# Patient Record
Sex: Female | Born: 1965 | Race: White | Hispanic: No | Marital: Married | Smoking: Never smoker
Health system: Southern US, Community
[De-identification: ages and names within clinical notes are randomized; demographics above are authoritative.]

## PROBLEM LIST (undated history)

## (undated) DIAGNOSIS — E079 Disorder of thyroid, unspecified: Secondary | ICD-10-CM

## (undated) DIAGNOSIS — R7303 Prediabetes: Secondary | ICD-10-CM

## (undated) DIAGNOSIS — C801 Malignant (primary) neoplasm, unspecified: Secondary | ICD-10-CM

## (undated) DIAGNOSIS — M1990 Unspecified osteoarthritis, unspecified site: Secondary | ICD-10-CM

## (undated) HISTORY — PX: DILATION AND CURETTAGE OF UTERUS: SHX78

## (undated) HISTORY — DX: Disorder of thyroid, unspecified: E07.9

## (undated) HISTORY — DX: Unspecified osteoarthritis, unspecified site: M19.90

## (undated) HISTORY — PX: TONSILLECTOMY: SUR1361

---

## 1998-05-24 ENCOUNTER — Other Ambulatory Visit: Admission: RE | Admit: 1998-05-24 | Discharge: 1998-05-24 | Payer: Self-pay | Admitting: Obstetrics and Gynecology

## 1999-05-05 ENCOUNTER — Encounter (INDEPENDENT_AMBULATORY_CARE_PROVIDER_SITE_OTHER): Payer: Self-pay | Admitting: Specialist

## 1999-05-05 ENCOUNTER — Ambulatory Visit (HOSPITAL_COMMUNITY): Admission: RE | Admit: 1999-05-05 | Discharge: 1999-05-05 | Payer: Self-pay | Admitting: Obstetrics and Gynecology

## 1999-10-26 ENCOUNTER — Other Ambulatory Visit: Admission: RE | Admit: 1999-10-26 | Discharge: 1999-10-26 | Payer: Self-pay | Admitting: Obstetrics and Gynecology

## 2000-05-05 ENCOUNTER — Inpatient Hospital Stay (HOSPITAL_COMMUNITY): Admission: AD | Admit: 2000-05-05 | Discharge: 2000-05-07 | Payer: Self-pay | Admitting: Obstetrics and Gynecology

## 2000-05-08 ENCOUNTER — Encounter: Admission: RE | Admit: 2000-05-08 | Discharge: 2000-08-06 | Payer: Self-pay | Admitting: Obstetrics and Gynecology

## 2000-06-12 ENCOUNTER — Other Ambulatory Visit: Admission: RE | Admit: 2000-06-12 | Discharge: 2000-06-12 | Payer: Self-pay | Admitting: Obstetrics and Gynecology

## 2001-06-26 ENCOUNTER — Other Ambulatory Visit: Admission: RE | Admit: 2001-06-26 | Discharge: 2001-06-26 | Payer: Self-pay | Admitting: Obstetrics and Gynecology

## 2002-09-23 ENCOUNTER — Other Ambulatory Visit: Admission: RE | Admit: 2002-09-23 | Discharge: 2002-09-23 | Payer: Self-pay | Admitting: Obstetrics and Gynecology

## 2003-04-07 ENCOUNTER — Inpatient Hospital Stay (HOSPITAL_COMMUNITY): Admission: AD | Admit: 2003-04-07 | Discharge: 2003-04-09 | Payer: Self-pay | Admitting: Obstetrics and Gynecology

## 2003-05-20 ENCOUNTER — Other Ambulatory Visit: Admission: RE | Admit: 2003-05-20 | Discharge: 2003-05-20 | Payer: Self-pay | Admitting: Obstetrics and Gynecology

## 2004-06-27 ENCOUNTER — Other Ambulatory Visit: Admission: RE | Admit: 2004-06-27 | Discharge: 2004-06-27 | Payer: Self-pay | Admitting: Obstetrics and Gynecology

## 2005-10-19 ENCOUNTER — Other Ambulatory Visit: Admission: RE | Admit: 2005-10-19 | Discharge: 2005-10-19 | Payer: Self-pay | Admitting: Obstetrics and Gynecology

## 2008-09-04 HISTORY — PX: TOTAL HIP ARTHROPLASTY: SHX124

## 2010-04-27 ENCOUNTER — Inpatient Hospital Stay (HOSPITAL_COMMUNITY): Admission: RE | Admit: 2010-04-27 | Discharge: 2010-04-30 | Payer: Self-pay | Admitting: Orthopedic Surgery

## 2010-09-27 ENCOUNTER — Encounter
Admission: RE | Admit: 2010-09-27 | Discharge: 2010-09-27 | Payer: Self-pay | Source: Home / Self Care | Attending: Obstetrics and Gynecology | Admitting: Obstetrics and Gynecology

## 2010-11-18 LAB — COMPREHENSIVE METABOLIC PANEL
Alkaline Phosphatase: 65 U/L (ref 39–117)
Alkaline Phosphatase: 71 U/L (ref 39–117)
BUN: 10 mg/dL (ref 6–23)
BUN: 9 mg/dL (ref 6–23)
CO2: 31 mEq/L (ref 19–32)
Calcium: 9.7 mg/dL (ref 8.4–10.5)
Chloride: 106 mEq/L (ref 96–112)
Chloride: 106 mEq/L (ref 96–112)
GFR calc Af Amer: >60 mL/min (ref >60)
GFR calc non Af Amer: >60 mL/min (ref >60)
Potassium: 4.9 mEq/L (ref 3.5–5.1)
Sodium: 139 mEq/L (ref 135–145)

## 2010-11-18 LAB — BASIC METABOLIC PANEL
BUN: 2 mg/dL — ABNORMAL LOW (ref 6–23)
CO2: 27 mEq/L (ref 19–32)
Calcium: 8.3 mg/dL — ABNORMAL LOW (ref 8.4–10.5)
Chloride: 109 mEq/L (ref 96–112)
Creatinine, Ser: 0.52 mg/dL (ref 0.4–1.2)
GFR calc Af Amer: >60 mL/min (ref >60)
GFR calc Af Amer: >60 mL/min (ref >60)
GFR calc non Af Amer: >60 mL/min (ref >60)
GFR calc non Af Amer: >60 mL/min (ref >60)
Potassium: 4.6 mEq/L (ref 3.5–5.1)
Sodium: 137 mEq/L (ref 135–145)
Sodium: 140 mEq/L (ref 135–145)

## 2010-11-18 LAB — SURGICAL PCR SCREEN
MRSA, PCR: NEGATIVE
Staphylococcus aureus: POSITIVE — AB

## 2010-11-18 LAB — PREGNANCY, URINE: Preg Test, Ur: NEGATIVE

## 2010-11-18 LAB — URINALYSIS, ROUTINE W REFLEX MICROSCOPIC
Hgb urine dipstick: NEGATIVE
Nitrite: NEGATIVE
Protein, ur: NEGATIVE mg/dL
Specific Gravity, Urine: 1.004 — ABNORMAL LOW (ref 1.005–1.030)
Urobilinogen, UA: 0.2 mg/dL (ref 0.0–1.0)

## 2010-11-18 LAB — TYPE AND SCREEN: ABO/RH(D): O POS

## 2010-11-18 LAB — CBC
HCT: 30.5 % — ABNORMAL LOW (ref 36.0–46.0)
Hemoglobin: 10 g/dL — ABNORMAL LOW (ref 12.0–15.0)
Hemoglobin: 10.6 g/dL — ABNORMAL LOW (ref 12.0–15.0)
MCH: 30.7 pg (ref 26.0–34.0)
MCHC: 34.3 g/dL (ref 30.0–36.0)
MCHC: 34.7 g/dL (ref 30.0–36.0)
MCHC: 34.7 g/dL (ref 30.0–36.0)
MCV: 90 fL (ref 78.0–100.0)
MCV: 90.2 fL (ref 78.0–100.0)
MCV: 91.3 fL (ref 78.0–100.0)
Platelets: 194 10*3/uL (ref 150–400)
RBC: 3.22 MIL/uL — ABNORMAL LOW (ref 3.87–5.11)
RBC: 3.39 MIL/uL — ABNORMAL LOW (ref 3.87–5.11)
RBC: 4.85 MIL/uL (ref 3.87–5.11)
RDW: 12.1 % (ref 11.5–15.5)
RDW: 12.6 % (ref 11.5–15.5)
WBC: 8.8 10*3/uL (ref 4.0–10.5)

## 2010-11-18 LAB — PROTIME-INR
INR: 2.05 — ABNORMAL HIGH (ref 0.00–1.49)
Prothrombin Time: 13.4 seconds (ref 11.6–15.2)
Prothrombin Time: 16 seconds — ABNORMAL HIGH (ref 11.6–15.2)
Prothrombin Time: 19.8 seconds — ABNORMAL HIGH (ref 11.6–15.2)

## 2011-01-16 ENCOUNTER — Other Ambulatory Visit: Payer: Self-pay | Admitting: Obstetrics and Gynecology

## 2011-01-16 DIAGNOSIS — N611 Abscess of the breast and nipple: Secondary | ICD-10-CM

## 2011-01-27 ENCOUNTER — Ambulatory Visit
Admission: RE | Admit: 2011-01-27 | Discharge: 2011-01-27 | Disposition: A | Discharge status: Home / Self Care | Payer: 59 | Source: Ambulatory Visit | Attending: Obstetrics and Gynecology | Admitting: Obstetrics and Gynecology

## 2011-01-27 DIAGNOSIS — N611 Abscess of the breast and nipple: Secondary | ICD-10-CM

## 2011-07-11 ENCOUNTER — Inpatient Hospital Stay (HOSPITAL_COMMUNITY): Admit: 2011-07-11 | Payer: Self-pay

## 2013-02-11 ENCOUNTER — Other Ambulatory Visit: Payer: Self-pay | Admitting: Obstetrics and Gynecology

## 2013-02-11 DIAGNOSIS — R922 Inconclusive mammogram: Secondary | ICD-10-CM

## 2013-02-11 DIAGNOSIS — R9230 Dense breasts, unspecified: Secondary | ICD-10-CM

## 2013-02-17 ENCOUNTER — Ambulatory Visit
Admission: RE | Admit: 2013-02-17 | Discharge: 2013-02-17 | Disposition: A | Discharge status: Home / Self Care | Payer: PRIVATE HEALTH INSURANCE | Source: Ambulatory Visit | Attending: Obstetrics and Gynecology | Admitting: Obstetrics and Gynecology

## 2013-02-17 DIAGNOSIS — R922 Inconclusive mammogram: Secondary | ICD-10-CM

## 2013-02-17 MED ORDER — GADOBENATE DIMEGLUMINE 529 MG/ML IV SOLN
11.0000 mL | Freq: Once | INTRAVENOUS | Status: AC | PRN
  Administered 2013-02-17: 11 mL via INTRAVENOUS

## 2013-03-31 ENCOUNTER — Other Ambulatory Visit: Payer: Self-pay | Admitting: Endocrinology

## 2013-03-31 DIAGNOSIS — E049 Nontoxic goiter, unspecified: Secondary | ICD-10-CM

## 2013-04-02 ENCOUNTER — Ambulatory Visit
Admission: RE | Admit: 2013-04-02 | Discharge: 2013-04-02 | Disposition: A | Discharge status: Home / Self Care | Payer: PRIVATE HEALTH INSURANCE | Source: Ambulatory Visit | Attending: Endocrinology | Admitting: Endocrinology

## 2013-04-02 DIAGNOSIS — E049 Nontoxic goiter, unspecified: Secondary | ICD-10-CM

## 2013-04-07 ENCOUNTER — Other Ambulatory Visit: Payer: Self-pay | Admitting: Endocrinology

## 2013-04-07 DIAGNOSIS — E042 Nontoxic multinodular goiter: Secondary | ICD-10-CM

## 2013-04-09 ENCOUNTER — Other Ambulatory Visit (HOSPITAL_COMMUNITY)
Admission: RE | Admit: 2013-04-09 | Discharge: 2013-04-09 | Disposition: A | Discharge status: Home / Self Care | Payer: 59 | Source: Ambulatory Visit | Attending: Interventional Radiology | Admitting: Interventional Radiology

## 2013-04-09 ENCOUNTER — Ambulatory Visit
Admission: RE | Admit: 2013-04-09 | Discharge: 2013-04-09 | Disposition: A | Discharge status: Home / Self Care | Payer: 59 | Source: Ambulatory Visit | Attending: Endocrinology | Admitting: Endocrinology

## 2013-04-09 DIAGNOSIS — E041 Nontoxic single thyroid nodule: Secondary | ICD-10-CM | POA: Insufficient documentation

## 2013-04-09 DIAGNOSIS — E042 Nontoxic multinodular goiter: Secondary | ICD-10-CM

## 2013-04-28 ENCOUNTER — Encounter (INDEPENDENT_AMBULATORY_CARE_PROVIDER_SITE_OTHER): Payer: Self-pay | Admitting: Surgery

## 2013-04-30 ENCOUNTER — Encounter (INDEPENDENT_AMBULATORY_CARE_PROVIDER_SITE_OTHER): Payer: Self-pay | Admitting: Surgery

## 2013-04-30 ENCOUNTER — Ambulatory Visit (INDEPENDENT_AMBULATORY_CARE_PROVIDER_SITE_OTHER): Payer: 59 | Admitting: Surgery

## 2013-04-30 VITALS — BP 110/64 | HR 62 | Temp 97.7°F | Resp 12 | Ht 64.0 in | Wt 131.0 lb

## 2013-04-30 DIAGNOSIS — D449 Neoplasm of uncertain behavior of unspecified endocrine gland: Secondary | ICD-10-CM

## 2013-04-30 DIAGNOSIS — D440 Neoplasm of uncertain behavior of thyroid gland: Secondary | ICD-10-CM | POA: Insufficient documentation

## 2013-04-30 DIAGNOSIS — E042 Nontoxic multinodular goiter: Secondary | ICD-10-CM | POA: Insufficient documentation

## 2013-04-30 NOTE — Progress Notes (Signed)
General Surgery [facility]  Chief Complaint  Patient presents with  . New Evaluation    thyroid nodules with Hurthle cell changes - referral from Dr. Dorisann Frames    HISTORY: Patient is a 47 year old female referred by her endocrinologist and her gynecologist for evaluation of bilateral thyroid nodules. These were found on physical examination. Patient underwent a thyroid ultrasound which showed a dominant nodule in the right lobe measuring 2.4 cm and a second nodule in the inferior left lobe measuring 1.4 cm. Subsequent fine-needle aspiration biopsy showed cytologic atypia with Hurthle cell change and nuclear changes in the left thyroid nodule. Right thyroid nodule cytopathology was benign. TSH level was slightly elevated at 4.79 and the patient was recently started on Synthroid 75 mcg daily. Patient is now referred for consideration for thyroidectomy for definitive diagnosis of the suspicious left thyroid nodule.  Patient has a twin sister with hypothyroidism. There is no family history of thyroid malignancy. There is no family history of other endocrine neoplasm. Patient has had no prior thyroid problems. She has never been on thyroid medication until the present time.  Past Medical History  Diagnosis Date  . Thyroid disease   . Arthritis     Current Outpatient Prescriptions  Medication Sig Dispense Refill  . levothyroxine (SYNTHROID, LEVOTHROID) 75 MCG tablet Take 75 mcg by mouth daily before breakfast.       No current facility-administered medications for this visit.    No Known Allergies  History reviewed. No pertinent family history.  History   Social History  . Marital Status: Married    Spouse Name: N/A    Number of Children: N/A  . Years of Education: N/A   Social History Main Topics  . Smoking status: Never Smoker   . Smokeless tobacco: None  . Alcohol Use: Yes     Comment: 1/2 a week  . Drug Use: No  . Sexual Activity: None   Other  Topics Concern  . None   Social History Narrative  . None    REVIEW OF SYSTEMS - PERTINENT POSITIVES ONLY: Denies tremor. Occasional palpitations. No significant compressive symptoms.  EXAM: Filed Vitals:   04/30/13 0854  BP: 110/64  Pulse: 62  Temp: 97.7 F (36.5 C)  Resp: 12    HEENT: normocephalic; pupils equal and reactive; sclerae clear; dentition good; mucous membranes moist NECK:  Dominant right thyroid nodule, 2.0 cm, mobile, nontender; left thyroid lobe without palpable abnormality; asymmetric on extension; no palpable anterior or posterior cervical lymphadenopathy; no supraclavicular masses; no tenderness CHEST: clear to auscultation bilaterally without rales, rhonchi, or wheezes CARDIAC: regular rate and rhythm without significant murmur; peripheral pulses are full EXT:  non-tender without edema; no deformity NEURO: no gross focal deficits; no sign of tremor   LABORATORY RESULTS: See [facility] (CHL-Epic) for most recent results  RADIOLOGY RESULTS: See [facility] (CHL-Epic) for most recent results  IMPRESSION: #1 multinodular goiter, nontoxic #2 neoplasm of uncertain behavior, left thyroid lobe, 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 I have recommended total thyroidectomy for management of bilateral thyroid nodules and definitive diagnosis of her left thyroid nodule with cytologic atypia. We discussed the procedure at length. We discussed risk and benefits including risk of injury to recurrent laryngeal nerves and parathyroid glands. We discussed the cosmetic results to be anticipated. We discussed the hospital stay. We discussed the  need for lifelong thyroid hormone replacement. We discussed the potential need for radioactive iodine treatment. She understands and wishes to proceed in the near future.  The risks and benefits of the  procedure have been discussed at length with the patient.  The patient understands the proposed procedure, potential alternative treatments, and the course of recovery to be expected.  All of the patient's questions have been answered at this time.  The patient wishes to proceed with surgery.  Velora Heckler, MD, FACS General & Endocrine Surgery [facility]  Primary Care Physician: Turner Daniels, MD

## 2013-04-30 NOTE — Patient Instructions (Signed)

## 2013-05-20 ENCOUNTER — Encounter (HOSPITAL_COMMUNITY): Payer: Self-pay | Admitting: Pharmacy Technician

## 2013-05-21 ENCOUNTER — Other Ambulatory Visit (HOSPITAL_COMMUNITY): Payer: Self-pay | Admitting: Surgery

## 2013-05-21 NOTE — Progress Notes (Signed)
ekg 05-02-13 fast med on chart Chest 2 view xray 05-02-13 fast med on chart

## 2013-05-21 NOTE — Patient Instructions (Addendum)
20 VERNADETTE STUTSMAN  05/21/2013   Your procedure is scheduled on: 05-29-2013  THURSDAY  Report to [facility] at 1100 AM.  Call this number if you have problems the morning of surgery [phone]   Remember:   Do not eat food  :After Midnight. Wednesday NIGHT   clear liquids midnight until 730 am day of surgery, then nothing by mouth.   Take these medicines the morning of surgery with A SIP OF WATER: NONE                                SEE [place] PREPARING FOR SURGERY SHEET             You may not have any metal on your body including hair pins and piercings  Do not wear jewelry, make-up.  Do not wear lotions, powders, or perfumes. You may wear deodorant.   Men may shave face and neck.  Do not bring valuables to the hospital. [facility] IS NOT RESPONSIBLE FOR VALUEABLES.  Contacts, dentures or bridgework may not be worn into surgery.  Leave suitcase in the car. After surgery it may be brought to your room.  For patients admitted to the hospital, checkout time is 11:00 AM the day of discharge.   Patients discharged the day of surgery will not be allowed to drive home.  Name and phone number of your driver:  Special Instructions: N/A   Please read over the following fact sheets that you were given: clear liquid sheet  Call Theodis Aguas RN pre op nurse if needed [phone]    FAILURE TO FOLLOW THESE INSTRUCTIONS MAY RESULT IN THE CANCELLATION OF YOUR SURGERY.  PATIENT SIGNATURE___________________________________________  NURSE SIGNATURE_____________________________________________

## 2013-05-22 ENCOUNTER — Encounter (HOSPITAL_COMMUNITY)
Admission: RE | Admit: 2013-05-22 | Discharge: 2013-05-22 | Disposition: A | Discharge status: Home / Self Care | Payer: 59 | Source: Ambulatory Visit | Attending: Surgery | Admitting: Surgery

## 2013-05-22 ENCOUNTER — Encounter (HOSPITAL_COMMUNITY): Payer: Self-pay

## 2013-05-22 DIAGNOSIS — Z01812 Encounter for preprocedural laboratory examination: Secondary | ICD-10-CM | POA: Insufficient documentation

## 2013-05-22 DIAGNOSIS — Z01818 Encounter for other preprocedural examination: Secondary | ICD-10-CM | POA: Insufficient documentation

## 2013-05-22 LAB — CBC
Hemoglobin: 15.2 g/dL — ABNORMAL HIGH (ref 12.0–15.0)
MCH: 29.9 pg (ref 26.0–34.0)
MCV: 87.8 fL (ref 78.0–100.0)
Platelets: 275 10*3/uL (ref 150–400)
RBC: 5.08 MIL/uL (ref 3.87–5.11)

## 2013-05-27 NOTE — Progress Notes (Signed)
Quick Note:  These results are acceptable for scheduled surgery.  Daden Mahany M. Lister Brizzi, MD, FACS [facility] Office: [phone]   ______ 

## 2013-05-29 ENCOUNTER — Observation Stay (HOSPITAL_COMMUNITY)
Admission: RE | Admit: 2013-05-29 | Discharge: 2013-05-30 | Disposition: A | Discharge status: Home / Self Care | Payer: 59 | Source: Ambulatory Visit | Attending: Surgery | Admitting: Surgery

## 2013-05-29 ENCOUNTER — Encounter (HOSPITAL_COMMUNITY)
Admission: RE | Disposition: A | Discharge status: Home / Self Care | Payer: Self-pay | Source: Ambulatory Visit | Attending: Surgery

## 2013-05-29 ENCOUNTER — Encounter (HOSPITAL_COMMUNITY): Payer: Self-pay | Admitting: *Deleted

## 2013-05-29 ENCOUNTER — Ambulatory Visit (HOSPITAL_COMMUNITY): Payer: 59 | Admitting: Anesthesiology

## 2013-05-29 ENCOUNTER — Encounter (HOSPITAL_COMMUNITY): Payer: Self-pay | Admitting: Anesthesiology

## 2013-05-29 DIAGNOSIS — E063 Autoimmune thyroiditis: Secondary | ICD-10-CM

## 2013-05-29 DIAGNOSIS — Z79899 Other long term (current) drug therapy: Secondary | ICD-10-CM | POA: Insufficient documentation

## 2013-05-29 DIAGNOSIS — E042 Nontoxic multinodular goiter: Principal | ICD-10-CM | POA: Insufficient documentation

## 2013-05-29 DIAGNOSIS — D440 Neoplasm of uncertain behavior of thyroid gland: Secondary | ICD-10-CM | POA: Diagnosis present

## 2013-05-29 DIAGNOSIS — E069 Thyroiditis, unspecified: Secondary | ICD-10-CM | POA: Insufficient documentation

## 2013-05-29 HISTORY — PX: THYROIDECTOMY: SHX17

## 2013-05-29 SURGERY — THYROIDECTOMY
Anesthesia: General | Site: Neck | Wound class: Clean

## 2013-05-29 MED ORDER — HYDROMORPHONE HCL PF 1 MG/ML IJ SOLN
0.2500 mg | INTRAMUSCULAR | Status: DC | PRN
  Administered 2013-05-29 (×4): 0.5 mg via INTRAVENOUS

## 2013-05-29 MED ORDER — SUCCINYLCHOLINE CHLORIDE 20 MG/ML IJ SOLN
INTRAMUSCULAR | Status: DC | PRN
  Administered 2013-05-29: 100 mg via INTRAVENOUS

## 2013-05-29 MED ORDER — HYDROCODONE-ACETAMINOPHEN 5-325 MG PO TABS
1.0000 | ORAL_TABLET | ORAL | Status: DC | PRN
  Administered 2013-05-30: 1 via ORAL
  Filled 2013-05-29 (×2): qty 1

## 2013-05-29 MED ORDER — CEFAZOLIN SODIUM-DEXTROSE 2-3 GM-% IV SOLR
INTRAVENOUS | Status: AC
  Filled 2013-05-29: qty 50

## 2013-05-29 MED ORDER — ONDANSETRON HCL 4 MG/2ML IJ SOLN
INTRAMUSCULAR | Status: DC | PRN
  Administered 2013-05-29: 4 mg via INTRAVENOUS

## 2013-05-29 MED ORDER — NEOSTIGMINE METHYLSULFATE 1 MG/ML IJ SOLN
INTRAMUSCULAR | Status: DC | PRN
  Administered 2013-05-29: 4 mg via INTRAVENOUS

## 2013-05-29 MED ORDER — ONDANSETRON HCL 4 MG PO TABS: 4.0000 mg | ORAL_TABLET | Freq: Four times a day (QID) | ORAL | Status: DC | PRN

## 2013-05-29 MED ORDER — KCL IN DEXTROSE-NACL 20-5-0.45 MEQ/L-%-% IV SOLN
INTRAVENOUS | Status: DC
  Administered 2013-05-29 – 2013-05-30 (×2): via INTRAVENOUS
  Filled 2013-05-29 (×2): qty 1000

## 2013-05-29 MED ORDER — GLYCOPYRROLATE 0.2 MG/ML IJ SOLN
INTRAMUSCULAR | Status: DC | PRN
  Administered 2013-05-29: .6 mg via INTRAVENOUS

## 2013-05-29 MED ORDER — HYDROMORPHONE HCL PF 1 MG/ML IJ SOLN
1.0000 mg | INTRAMUSCULAR | Status: DC | PRN
  Administered 2013-05-29: 1 mg via INTRAVENOUS
  Filled 2013-05-29: qty 1

## 2013-05-29 MED ORDER — HYDROMORPHONE HCL PF 1 MG/ML IJ SOLN
INTRAMUSCULAR | Status: AC
  Filled 2013-05-29: qty 1

## 2013-05-29 MED ORDER — LACTATED RINGERS IV SOLN
INTRAVENOUS | Status: DC
  Administered 2013-05-29 (×2): via INTRAVENOUS
  Administered 2013-05-29: 1000 mL via INTRAVENOUS

## 2013-05-29 MED ORDER — CALCIUM CARBONATE 1250 (500 CA) MG PO TABS
2.0000 | ORAL_TABLET | Freq: Three times a day (TID) | ORAL | Status: DC
  Administered 2013-05-29 – 2013-05-30 (×3): 1000 mg via ORAL
  Filled 2013-05-29 (×5): qty 2

## 2013-05-29 MED ORDER — ACETAMINOPHEN 325 MG PO TABS
650.0000 mg | ORAL_TABLET | ORAL | Status: DC | PRN
  Administered 2013-05-29 – 2013-05-30 (×2): 650 mg via ORAL
  Filled 2013-05-29 (×2): qty 2

## 2013-05-29 MED ORDER — 0.9 % SODIUM CHLORIDE (POUR BTL) OPTIME
TOPICAL | Status: DC | PRN
  Administered 2013-05-29: 1000 mL

## 2013-05-29 MED ORDER — FENTANYL CITRATE 0.05 MG/ML IJ SOLN
INTRAMUSCULAR | Status: DC | PRN
  Administered 2013-05-29 (×4): 50 ug via INTRAVENOUS
  Administered 2013-05-29: 150 ug via INTRAVENOUS
  Administered 2013-05-29: 50 ug via INTRAVENOUS

## 2013-05-29 MED ORDER — LACTATED RINGERS IV SOLN: INTRAVENOUS | Status: DC

## 2013-05-29 MED ORDER — PROPOFOL 10 MG/ML IV BOLUS
INTRAVENOUS | Status: DC | PRN
  Administered 2013-05-29: 150 mg via INTRAVENOUS

## 2013-05-29 MED ORDER — MIDAZOLAM HCL 5 MG/5ML IJ SOLN
INTRAMUSCULAR | Status: DC | PRN
  Administered 2013-05-29: 2 mg via INTRAVENOUS

## 2013-05-29 MED ORDER — ONDANSETRON HCL 4 MG/2ML IJ SOLN
4.0000 mg | Freq: Four times a day (QID) | INTRAMUSCULAR | Status: DC | PRN
  Administered 2013-05-30: 4 mg via INTRAVENOUS
  Filled 2013-05-29: qty 2

## 2013-05-29 MED ORDER — LIDOCAINE HCL (CARDIAC) 20 MG/ML IV SOLN
INTRAVENOUS | Status: DC | PRN
  Administered 2013-05-29: 100 mg via INTRAVENOUS

## 2013-05-29 MED ORDER — CEFAZOLIN SODIUM-DEXTROSE 2-3 GM-% IV SOLR
2.0000 g | INTRAVENOUS | Status: AC
  Administered 2013-05-29: 2 g via INTRAVENOUS

## 2013-05-29 MED ORDER — DEXAMETHASONE SODIUM PHOSPHATE 10 MG/ML IJ SOLN
INTRAMUSCULAR | Status: DC | PRN
  Administered 2013-05-29: 10 mg via INTRAVENOUS

## 2013-05-29 MED ORDER — ROCURONIUM BROMIDE 100 MG/10ML IV SOLN
INTRAVENOUS | Status: DC | PRN
  Administered 2013-05-29: 10 mg via INTRAVENOUS
  Administered 2013-05-29: 30 mg via INTRAVENOUS
  Administered 2013-05-29 (×3): 10 mg via INTRAVENOUS

## 2013-05-29 SURGICAL SUPPLY — 39 items
APL SKNCLS STERI-STRIP NONHPOA (GAUZE/BANDAGES/DRESSINGS) ×1
ATTRACTOMAT 16X20 MAGNETIC DRP (DRAPES) ×2 IMPLANT
BENZOIN TINCTURE PRP APPL 2/3 (GAUZE/BANDAGES/DRESSINGS) ×2 IMPLANT
BLADE HEX COATED 2.75 (ELECTRODE) ×2 IMPLANT
BLADE SURG 15 STRL LF DISP TIS (BLADE) ×1 IMPLANT
BLADE SURG 15 STRL SS (BLADE) ×2
CANISTER SUCTION 2500CC (MISCELLANEOUS) ×2 IMPLANT
CHLORAPREP W/TINT 26ML (MISCELLANEOUS) ×2 IMPLANT
CLIP TI MEDIUM 6 (CLIP) ×5 IMPLANT
CLIP TI WIDE RED SMALL 6 (CLIP) ×7 IMPLANT
CLOTH BEACON ORANGE TIMEOUT ST (SAFETY) ×2 IMPLANT
DISSECTOR ROUND CHERRY 3/8 STR (MISCELLANEOUS) IMPLANT
DRAPE PED LAPAROTOMY (DRAPES) ×2 IMPLANT
DRESSING SURGICEL FIBRLLR 1X2 (HEMOSTASIS) ×1 IMPLANT
DRSG SURGICEL FIBRILLAR 1X2 (HEMOSTASIS) ×2
ELECT REM PT RETURN 9FT ADLT (ELECTROSURGICAL) ×2
ELECTRODE REM PT RTRN 9FT ADLT (ELECTROSURGICAL) ×1 IMPLANT
GAUZE SPONGE 4X4 16PLY XRAY LF (GAUZE/BANDAGES/DRESSINGS) ×3 IMPLANT
GLOVE SURG ORTHO 8.0 STRL STRW (GLOVE) ×2 IMPLANT
GOWN STRL REIN XL XLG (GOWN DISPOSABLE) ×4 IMPLANT
KIT BASIN OR (CUSTOM PROCEDURE TRAY) ×2 IMPLANT
NS IRRIG 1000ML POUR BTL (IV SOLUTION) ×2 IMPLANT
PACK BASIC VI WITH GOWN DISP (CUSTOM PROCEDURE TRAY) ×2 IMPLANT
PENCIL BUTTON HOLSTER BLD 10FT (ELECTRODE) ×2 IMPLANT
SHEARS HARMONIC 9CM CVD (BLADE) ×2 IMPLANT
SPONGE GAUZE 4X4 12PLY (GAUZE/BANDAGES/DRESSINGS) ×1 IMPLANT
STAPLER VISISTAT 35W (STAPLE) IMPLANT
STRIP CLOSURE SKIN 1/2X4 (GAUZE/BANDAGES/DRESSINGS) ×2 IMPLANT
SUT MNCRL AB 4-0 PS2 18 (SUTURE) ×2 IMPLANT
SUT SILK 2 0 (SUTURE)
SUT SILK 2-0 18XBRD TIE 12 (SUTURE) IMPLANT
SUT SILK 3 0 (SUTURE)
SUT SILK 3-0 18XBRD TIE 12 (SUTURE) IMPLANT
SUT VIC AB 3-0 SH 18 (SUTURE) ×4 IMPLANT
SYR BULB IRRIGATION 50ML (SYRINGE) ×2 IMPLANT
TAPE CLOTH SURG 4X10 WHT LF (GAUZE/BANDAGES/DRESSINGS) ×1 IMPLANT
TOWEL OR 17X26 10 PK STRL BLUE (TOWEL DISPOSABLE) ×2 IMPLANT
TOWEL OR NON WOVEN STRL DISP B (DISPOSABLE) ×2 IMPLANT
YANKAUER SUCT BULB TIP 10FT TU (MISCELLANEOUS) ×2 IMPLANT

## 2013-05-29 NOTE — Brief Op Note (Signed)
05/29/2013  2:33 PM  PATIENT:  Colleen Golden  47 y.o. female  PRE-OPERATIVE DIAGNOSIS:  bilateral thyroid nodules with atypia, chronic thyroiditis  POST-OPERATIVE DIAGNOSIS:  same  PROCEDURE:  Procedure(s): TOTAL THYROIDECTOMY (N/A)  SURGEON:  Surgeon(s) and Role:    * Velora Heckler, MD - Primary  ANESTHESIA:   general  EBL:  Total I/O In: 900 [I.V.:900] Out: 50 [Blood:50]  BLOOD ADMINISTERED:none  DRAINS: none   LOCAL MEDICATIONS USED:  NONE  SPECIMEN:  Excision  DISPOSITION OF SPECIMEN:  PATHOLOGY  COUNTS:  YES  TOURNIQUET:  * No tourniquets in log *  DICTATION: .Other Dictation: Dictation Number [id number]  PLAN OF CARE: Admit for overnight observation  PATIENT DISPOSITION:  PACU - hemodynamically stable.   Delay start of Pharmacological VTE agent (>24hrs) due to surgical blood loss or risk of bleeding: yes  Velora Heckler, MD, FACS General & Endocrine Surgery [facility] Office: [phone]

## 2013-05-29 NOTE — H&P (View-Only) (Signed)
General Surgery - [facility]  Chief Complaint  Patient presents with  . New Evaluation    thyroid nodules with Hurthle cell changes - referral from Dr. Bindubal Balan    HISTORY: Patient is a 46-year-old female referred by her endocrinologist and her gynecologist for evaluation of bilateral thyroid nodules. These were found on physical examination. Patient underwent a thyroid ultrasound which showed a dominant nodule in the right lobe measuring 2.4 cm and a second nodule in the inferior left lobe measuring 1.4 cm. Subsequent fine-needle aspiration biopsy showed cytologic atypia with Hurthle cell change and nuclear changes in the left thyroid nodule. Right thyroid nodule cytopathology was benign. TSH level was slightly elevated at 4.79 and the patient was recently started on Synthroid 75 mcg daily. Patient is now referred for consideration for thyroidectomy for definitive diagnosis of the suspicious left thyroid nodule.  Patient has a twin sister with hypothyroidism. There is no family history of thyroid malignancy. There is no family history of other endocrine neoplasm. Patient has had no prior thyroid problems. She has never been on thyroid medication until the present time.  Past Medical History  Diagnosis Date  . Thyroid disease   . Arthritis     Current Outpatient Prescriptions  Medication Sig Dispense Refill  . levothyroxine (SYNTHROID, LEVOTHROID) 75 MCG tablet Take 75 mcg by mouth daily before breakfast.       No current facility-administered medications for this visit.    No Known Allergies  History reviewed. No pertinent family history.  History   Social History  . Marital Status: Married    Spouse Name: N/A    Number of Children: N/A  . Years of Education: N/A   Social History Main Topics  . Smoking status: Never Smoker   . Smokeless tobacco: None  . Alcohol Use: Yes     Comment: 1/2 a week  . Drug Use: No  . Sexual Activity: None   Other  Topics Concern  . None   Social History Narrative  . None    REVIEW OF SYSTEMS - PERTINENT POSITIVES ONLY: Denies tremor. Occasional palpitations. No significant compressive symptoms.  EXAM: Filed Vitals:   04/30/13 0854  BP: 110/64  Pulse: 62  Temp: 97.7 F (36.5 C)  Resp: 12    HEENT: normocephalic; pupils equal and reactive; sclerae clear; dentition good; mucous membranes moist NECK:  Dominant right thyroid nodule, 2.0 cm, mobile, nontender; left thyroid lobe without palpable abnormality; asymmetric on extension; no palpable anterior or posterior cervical lymphadenopathy; no supraclavicular masses; no tenderness CHEST: clear to auscultation bilaterally without rales, rhonchi, or wheezes CARDIAC: regular rate and rhythm without significant murmur; peripheral pulses are full EXT:  non-tender without edema; no deformity NEURO: no gross focal deficits; no sign of tremor   LABORATORY RESULTS: See [facility] (CHL-Epic) for most recent results  RADIOLOGY RESULTS: See [facility] (CHL-Epic) for most recent results  IMPRESSION: #1 multinodular goiter, nontoxic #2 neoplasm of uncertain behavior, left thyroid lobe, 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 I have recommended total thyroidectomy for management of bilateral thyroid nodules and definitive diagnosis of her left thyroid nodule with cytologic atypia. We discussed the procedure at length. We discussed risk and benefits including risk of injury to recurrent laryngeal nerves and parathyroid glands. We discussed the cosmetic results to be anticipated. We discussed the hospital stay. We discussed the   need for lifelong thyroid hormone replacement. We discussed the potential need for radioactive iodine treatment. She understands and wishes to proceed in the near future.  The risks and benefits of the  procedure have been discussed at length with the patient.  The patient understands the proposed procedure, potential alternative treatments, and the course of recovery to be expected.  All of the patient's questions have been answered at this time.  The patient wishes to proceed with surgery.  Elynore Dolinski M. Kani Jobson, MD, FACS General & Endocrine Surgery [facility]  Primary Care Physician: LOWE,DAVID C, MD   

## 2013-05-29 NOTE — Anesthesia Preprocedure Evaluation (Signed)
Anesthesia Evaluation  Patient identified by MRN, date of birth, ID band Patient awake    Reviewed: Allergy & Precautions, H&P , NPO status , Patient's Chart, lab work & pertinent test results  Airway Mallampati: II TM Distance: >3 FB Neck ROM: full    Dental no notable dental hx. (+) Implants, Dental Advisory Given and Teeth Intact Left lateral upper incissor is an implant:   Pulmonary neg pulmonary ROS,  breath sounds clear to auscultation  Pulmonary exam normal       Cardiovascular Exercise Tolerance: Good negative cardio ROS  Rhythm:regular Rate:Normal     Neuro/Psych negative neurological ROS  negative psych ROS   GI/Hepatic negative GI ROS, Neg liver ROS,   Endo/Other  negative endocrine ROSHypothyroidism   Renal/GU negative Renal ROS  negative genitourinary   Musculoskeletal   Abdominal   Peds  Hematology negative hematology ROS (+)   Anesthesia Other Findings   Reproductive/Obstetrics negative OB ROS                           Anesthesia Physical Anesthesia Plan  ASA: I  Anesthesia Plan: General   Post-op Pain Management:    Induction: Intravenous  Airway Management Planned: Oral ETT  Additional Equipment:   Intra-op Plan:   Post-operative Plan: Extubation in OR  Informed Consent: I have reviewed the patients History and Physical, chart, labs and discussed the procedure including the risks, benefits and alternatives for the proposed anesthesia with the patient or authorized representative who has indicated his/her understanding and acceptance.   Dental Advisory Given  Plan Discussed with: CRNA and Surgeon  Anesthesia Plan Comments:         Anesthesia Quick Evaluation

## 2013-05-29 NOTE — Transfer of Care (Signed)
Immediate Anesthesia Transfer of Care Note  Patient: Colleen Golden  Procedure(s) Performed: Procedure(s): TOTAL THYROIDECTOMY (N/A)  Patient Location: PACU  Anesthesia Type:General  Level of Consciousness: awake, oriented, patient cooperative, lethargic and responds to stimulation  Airway & Oxygen Therapy: Patient Spontanous Breathing and Patient connected to face mask oxygen  Post-op Assessment: Report given to PACU RN, Post -op Vital signs reviewed and stable and Patient moving all extremities  Post vital signs: Reviewed and stable  Complications: No apparent anesthesia complications

## 2013-05-29 NOTE — Anesthesia Postprocedure Evaluation (Signed)
  Anesthesia Post-op Note  Patient: Colleen Golden  Procedure(s) Performed: Procedure(s) (LRB): TOTAL THYROIDECTOMY (N/A)  Patient Location: PACU  Anesthesia Type: General  Level of Consciousness: awake and alert   Airway and Oxygen Therapy: Patient Spontanous Breathing  Post-op Pain: mild  Post-op Assessment: Post-op Vital signs reviewed, Patient's Cardiovascular Status Stable, Respiratory Function Stable, Patent Airway and No signs of Nausea or vomiting  Last Vitals:  Filed Vitals:   05/29/13 1530  BP: 133/77  Pulse: 60  Temp:   Resp: 11    Post-op Vital Signs: stable   Complications: No apparent anesthesia complications

## 2013-05-29 NOTE — Interval H&P Note (Signed)
History and Physical Interval Note:  05/29/2013 12:28 PM  CADENCE MINTON  has presented today for surgery, with the diagnosis of bilateral nodules & atypia.  The various methods of treatment have been discussed with the patient and family. After consideration of risks, benefits and other options for treatment, the patient has consented to    Procedure(s): TOTAL THYROIDECTOMY (N/A) as a surgical intervention .    The patient's history has been reviewed, patient examined, no change in status, stable for surgery.  I have reviewed the patient's chart and labs.  Questions were answered to the patient's satisfaction.    Velora Heckler, MD, FACS General & Endocrine Surgery [facility] Office: [phone]   Quaniyah Bugh Judie Petit

## 2013-05-29 NOTE — Preoperative (Signed)
Beta Blockers   Reason not to administer Beta Blockers:Not Applicable 

## 2013-05-30 ENCOUNTER — Encounter (HOSPITAL_COMMUNITY): Payer: Self-pay | Admitting: Surgery

## 2013-05-30 LAB — BASIC METABOLIC PANEL
BUN: 8 mg/dL (ref 6–23)
CO2: 24 mEq/L (ref 19–32)
Chloride: 102 mEq/L (ref 96–112)
GFR calc non Af Amer: >90 mL/min (ref >90)
Glucose, Bld: 164 mg/dL — ABNORMAL HIGH (ref 70–99)
Potassium: 4.2 mEq/L (ref 3.5–5.1)
Sodium: 136 mEq/L (ref 135–145)

## 2013-05-30 MED ORDER — INFLUENZA VAC SPLIT QUAD 0.5 ML IM SUSP
0.5000 mL | INTRAMUSCULAR | Status: AC
Start: 2013-05-30 — End: 2013-05-30
  Administered 2013-05-30: 0.5 mL via INTRAMUSCULAR
  Filled 2013-05-30: qty 0.5

## 2013-05-30 MED ORDER — HYDROCODONE-ACETAMINOPHEN 5-325 MG PO TABS: 1.0000 | ORAL_TABLET | ORAL | Status: DC | PRN

## 2013-05-30 MED ORDER — CALCIUM CARBONATE 1250 (500 CA) MG PO TABS: 2.0000 | ORAL_TABLET | Freq: Three times a day (TID) | ORAL | Status: DC

## 2013-05-30 NOTE — Progress Notes (Signed)
Utilization review completed.  

## 2013-05-30 NOTE — Progress Notes (Signed)
Patient is alert and oriented, vital signs are stable, incision within normal limits, discharge instructions reviewed with patient and spouse, prescription given patient to follow up with MD Stanford Breed RN 05-30-2013 14:31pm

## 2013-05-30 NOTE — Progress Notes (Signed)
Quick Note:  Please contact patient and notify of benign pathology results.  Valla Pacey M. Lilie Vezina, MD, FACS [facility] Office: [phone]   ______ 

## 2013-05-30 NOTE — Op Note (Signed)
Colleen Golden, Colleen Golden NO.:  [id number]  MEDICAL RECORD NO.:  [id number]  LOCATION:  1535                         FACILITY:  [facility]  PHYSICIAN:  Velora Heckler, MD      DATE OF BIRTH:  Jan 16, 1966  DATE OF PROCEDURE:  05/29/2013                               OPERATIVE REPORT   PREOPERATIVE DIAGNOSIS:  Multinodular thyroid with cytologic atypia, chronic thyroiditis.  POSTOPERATIVE DIAGNOSIS:  Multinodular thyroid with cytologic atypia, chronic thyroiditis.  PROCEDURE:  Total thyroidectomy.  SURGEON:  Velora Heckler, MD, FACS  ANESTHESIA:  General  ESTIMATED BLOOD LOSS:  Minimal.  PREPARATION:  ChloraPrep.  COMPLICATIONS:  None.  INDICATIONS:  The patient is a 47 year old female referred by Dr. Dorisann Frames for evaluation of bilateral thyroid nodules.  These had been found on routine physical examination.  Ultrasound showed bilateral nodules.  Fine-needle aspiration biopsy showed cytologic atypia with Hurthle cell change and nuclear changes in the left thyroid nodule, which measured 1.4 cm in dimension.  The patient now comes to Surgery for total thyroidectomy for definitive diagnosis.  BODY OF REPORT:  Procedure was done in OR #1 at the [facility].  The patient was brought to the operating room, placed in a supine position on the operating room table.  Following administration of general anesthesia, the patient was positioned and then prepped and draped in the usual strict aseptic fashion.  After ascertaining that an adequate level of anesthesia had been achieved, a Kocher incision was made with a #15 blade.  Dissection was carried through subcutaneous tissues and platysma.  Hemostasis was obtained with electrocautery.  Skin flaps were elevated cephalad and caudad from the thyroid notch to the sternal notch.  A Mahorner self-retaining retractor was placed for exposure.  Strap muscles were incised in the midline. Dissection was  begun on the left.  Left thyroid lobe was exposed by reflecting the strap muscles laterally.  There was some inflammatory change in the left thyroid lobe with adherence of the strap muscles and surrounding tissues.  Left lobe was gently dissected free with blunt dissection.  Venous tributaries were divided between Ligaclips with the Harmonic scalpel.  Superior pole was dissected out and superior pole vessels divided individually between small and medium Ligaclips with the Harmonic scalpel.  Both the superior and inferior parathyroid glands were identified on the left and preserved on their vascular pedicle. Inferior venous tributaries were divided between Ligaclips.  Gland was rolled anteriorly.  Branches of the inferior thyroid artery were divided between small Ligaclips with the Harmonic scalpel.  Recurrent laryngeal nerve was identified and preserved.  Ligament of Allyson Sabal was released with the electrocautery and the gland was mobilized onto the anterior trachea.  Isthmus was mobilized across the midline.  There was no significant pyramidal lobe identified.  Dry pack was placed in the left neck.  Next, we turned our attention to the right thyroid lobe.  Again strap muscles were reflected laterally.  Right lobe again demonstrated some inflammatory type changes.  Superior pole was mobilized and extends quite high in the right neck.  Vascular structures were divided between Ligaclips with the Harmonic scalpel.  Gland was rolled  anteriorly. Branches of the inferior thyroid artery were divided between small Ligaclips with the Harmonic scalpel.  Parathyroid tissue was preserved. Recurrent laryngeal nerve was identified and preserved.  Inferior venous tributaries were divided between Ligaclips.  Gland was mobilized onto the anterior trachea from which it was completely resected with the electrocautery.  Thyroid gland was marked with a suture in the left superior pole.  The entire thyroid gland  was submitted to Pathology for review.  Neck was irrigated with warm saline and hemostasis achieved with the electrocautery.  Fibrillar was placed throughout the operative field. Strap muscles were reapproximated in the midline with interrupted 3-0 Vicryl sutures.  Platysma was closed with interrupted 3-0 Vicryl sutures.  Skin was closed with a running 4-0 Monocryl subcuticular suture.  Wound was washed and dried and benzoin and Steri-Strips were applied.  Sterile dressings were applied.  The patient was awakened from anesthesia and brought to the recovery room.  The patient tolerated the procedure well.   Velora Heckler, MD, FACS General & Endocrine Surgery [facility] Office: [phone]   TMG/MEDQ  D:  05/29/2013  T:  05/30/2013  Job:  [id number]  cc:   Dorisann Frames, M.D. Fax: [phone]

## 2013-05-30 NOTE — Discharge Summary (Signed)
Physician Discharge Summary [facility]  Patient ID: Colleen Golden MRN: [id number] DOB/AGE: 1965/11/17 47 y.o.  Admit date: 05/29/2013 Discharge date: 05/30/2013  Admission Diagnoses:  Multinodular thyroid with atypia  Discharge Diagnoses:  Principal Problem:   Neoplasm of uncertain behavior of thyroid gland, left lobe Active Problems:   Multinodular goiter (nontoxic)   Discharged Condition: good  Hospital Course: Patient is a 47 year old female referred by her endocrinologist and her gynecologist for evaluation of bilateral thyroid nodules. These were found on physical examination. Patient underwent a thyroid ultrasound which showed a dominant nodule in the right lobe measuring 2.4 cm and a second nodule in the inferior left lobe measuring 1.4 cm. Subsequent fine-needle aspiration biopsy showed cytologic atypia with Hurthle cell change and nuclear changes in the left thyroid nodule. Right thyroid nodule cytopathology was benign. TSH level was slightly elevated at 4.79 and the patient was recently started on Synthroid 75 mcg daily. Patient is now referred for consideration for thyroidectomy for definitive diagnosis of the suspicious left thyroid nodule.  Patient underwent total thyroidectomy.  Post op course uncomplicated.  Calcium level 8.6 on morning following surgery.  Prepared for discharge home on POD#1.  Consults: None  Significant Diagnostic Studies: labs: calcium  Treatments: surgery: total thyroidectomy  Discharge Exam: Blood pressure 99/62, pulse 75, temperature 98.4 F (36.9 C), temperature source Oral, resp. rate 16, height 5\' 4"  (1.626 m), weight 132 lb 12.9 oz (60.24 kg), SpO2 100.00%. HEENT - clear Neck - soft; incision clear and dry; voice normal Chest - clear bilaterally Cor - RRR  Disposition: Home with family  Discharge Orders   Future Appointments Provider Department Dept Phone   06/18/2013 9:30 AM Velora Heckler, MD [facility]  Surgery, [place] [phone]   Future Orders Complete By Expires   Apply dressing  As directed    Comments:     Apply light gauze dressing to wound before discharge home today.   Diet - low sodium heart healthy  As directed    Discharge instructions  As directed    Comments:     THYROID & PARATHYROID SURGERY - POST OP INSTRUCTIONS  Always review your discharge instruction sheet from the facility where your surgery was performed.  A prescription for pain medication may be given to you upon discharge.  Take your pain medication as prescribed.  If narcotic pain medicine is not needed, then you may take acetaminophen (Tylenol) or ibuprofen (Advil) as needed.  Take your usually prescribed medications unless otherwise directed.  If you need a refill on your pain medication, please contact your pharmacy. They will contact our office to request authorization.  Prescriptions will not be processed after 5 pm or on weekends.  Start with a light diet upon arrival home, such as soup and crackers or toast.  Be sure to drink plenty of fluids daily.  Resume your normal diet the day after surgery.  Most patients will experience some swelling and bruising on the chest and neck area.  Ice packs will help.  Swelling and bruising can take several days to resolve.   It is common to experience some constipation if taking pain medication after surgery.  Increasing fluid intake and taking a stool softener will usually help or prevent this problem.  A mild laxative (Milk of Magnesia or Miralax) should be taken according to package directions if there are no bowel movements after 48 hours.  You may remove your bandages 24-48 hours after surgery, and you may shower  at that time.  You have steri-strips (small skin tapes) in place directly over the incision.  These strips should be left on the skin for 7-10 days and then removed.  You may resume regular (light) daily activities beginning the next day-such as daily  self-care, walking, climbing stairs-gradually increasing activities as tolerated.  You may have sexual intercourse when it is comfortable.  Refrain from any heavy lifting or straining until approved by your doctor.  You may drive when you no longer are taking prescription pain medication, you can comfortably wear a seatbelt, and you can safely maneuver your car and apply brakes.  You should see your doctor in the office for a follow-up appointment approximately two to three weeks after your surgery.  Make sure that you call for this appointment within a day or two after you arrive home to insure a convenient appointment time.  WHEN TO CALL YOUR DOCTOR: -- Fever greater than 101.5 -- Inability to urinate -- Nausea and/or vomiting - persistent -- Extreme swelling or bruising -- Continued bleeding from incision -- Increased pain, redness, or drainage from the incision -- Difficulty swallowing or breathing -- Muscle cramping or spasms -- Numbness or tingling in hands or around lips  The clinic staff is available to answer your questions during regular business hours.  Please don't hesitate to call and ask to speak to one of the nurses if you have concerns.  Velora Heckler, MD, FACS General & Endocrine Surgery [facility] Office: [phone]   Increase activity slowly  As directed    Remove dressing in 24 hours  As directed        Medication List         calcium carbonate 1250 MG tablet  Commonly known as:  OS-CAL - dosed in mg of elemental calcium  Take 2 tablets (1,000 mg of elemental calcium total) by mouth 3 (three) times daily with meals.     HYDROcodone-acetaminophen 5-325 MG per tablet  Commonly known as:  NORCO/VICODIN  Take 1-2 tablets by mouth every 4 (four) hours as needed.     levothyroxine 75 MCG tablet  Commonly known as:  SYNTHROID, LEVOTHROID  Take 75 mcg by mouth at bedtime.         Velora Heckler, MD, [facility] Office: [phone]   Signed: Velora Heckler 05/30/2013, 1:56 PM

## 2013-06-02 ENCOUNTER — Other Ambulatory Visit (INDEPENDENT_AMBULATORY_CARE_PROVIDER_SITE_OTHER): Payer: Self-pay

## 2013-06-02 ENCOUNTER — Telehealth (INDEPENDENT_AMBULATORY_CARE_PROVIDER_SITE_OTHER): Payer: Self-pay

## 2013-06-02 NOTE — Telephone Encounter (Signed)
Pt notified of path and lab slip mailed to pt.

## 2013-06-02 NOTE — Telephone Encounter (Signed)
Message copied by Joanette Gula on Mon Jun 02, 2013  9:06 AM ------      Message from: Velora Heckler      Created: Fri May 30, 2013  9:59 PM       Please contact patient and notify of benign pathology results.            Velora Heckler, MD, [facility]      Office: [phone]             ------

## 2013-06-02 NOTE — Progress Notes (Signed)
Pt notified of path result per Dr Ardine Eng request. Lab slip mailed to pt for calcium prior to po appt.

## 2013-06-18 ENCOUNTER — Ambulatory Visit (INDEPENDENT_AMBULATORY_CARE_PROVIDER_SITE_OTHER): Payer: 59 | Admitting: Surgery

## 2013-06-18 ENCOUNTER — Encounter (INDEPENDENT_AMBULATORY_CARE_PROVIDER_SITE_OTHER): Payer: Self-pay | Admitting: Surgery

## 2013-06-18 ENCOUNTER — Encounter (INDEPENDENT_AMBULATORY_CARE_PROVIDER_SITE_OTHER): Payer: Self-pay

## 2013-06-18 VITALS — BP 98/64 | HR 64 | Temp 99.3°F | Resp 14 | Ht 64.0 in | Wt 135.2 lb

## 2013-06-18 DIAGNOSIS — D440 Neoplasm of uncertain behavior of thyroid gland: Secondary | ICD-10-CM

## 2013-06-18 DIAGNOSIS — D449 Neoplasm of uncertain behavior of unspecified endocrine gland: Secondary | ICD-10-CM

## 2013-06-18 DIAGNOSIS — E042 Nontoxic multinodular goiter: Secondary | ICD-10-CM

## 2013-06-18 DIAGNOSIS — E890 Postprocedural hypothyroidism: Secondary | ICD-10-CM | POA: Insufficient documentation

## 2013-06-18 NOTE — Progress Notes (Signed)
General Surgery [facility]  Chief Complaint  Patient presents with  . Routine Post Op    total thyroidectomy 05/29/2013  (Dr. Dorisann Frames, Dr. Candice Camp)    HISTORY: Patient is a 47 year old white female who underwent total thyroidectomy on 05/29/2013. Final pathology shows lymphocytic thyroiditis. Patient has seen her endocrinologist postoperatively and her Synthroid dosage has been raised to 112 mcg daily. Patient has had no signs or symptoms of hypoparathyroidism. Overall she is doing well.  EXAM: Surgical incision is healing nicely. Minimal soft tissue swelling. No sign of infection. No sign of seroma. Voice quality is normal.  IMPRESSION: Status post total thyroidectomy for lymphocytic thyroiditis  PLAN: Patient will begin applying topical creams to her incision. She will return in 6 weeks for final wound check.  Velora Heckler, MD, FACS General & Endocrine Surgery [facility]   Visit Diagnoses: 1. Neoplasm of uncertain behavior of thyroid gland, left lobe   2. Multinodular goiter (nontoxic)   3. Hypothyroidism, postsurgical

## 2013-06-18 NOTE — Patient Instructions (Signed)
  COCOA BUTTER & VITAMIN E CREAM  (Palmer's or other brand)  Apply cocoa butter/vitamin E cream to your incision 2 - 3 times daily.  Massage cream into incision for one minute with each application.  Use sunscreen (50 SPF or higher) for first 6 months after surgery if area is exposed to sun.  You may substitute Mederma or other scar reducing creams as desired.   

## 2013-07-30 ENCOUNTER — Encounter (INDEPENDENT_AMBULATORY_CARE_PROVIDER_SITE_OTHER): Payer: 59 | Admitting: Surgery

## 2013-09-08 ENCOUNTER — Encounter (INDEPENDENT_AMBULATORY_CARE_PROVIDER_SITE_OTHER): Payer: Self-pay

## 2013-09-08 ENCOUNTER — Ambulatory Visit (INDEPENDENT_AMBULATORY_CARE_PROVIDER_SITE_OTHER): Payer: 59 | Admitting: Surgery

## 2013-09-08 ENCOUNTER — Encounter (INDEPENDENT_AMBULATORY_CARE_PROVIDER_SITE_OTHER): Payer: Self-pay | Admitting: Surgery

## 2013-09-08 VITALS — BP 110/68 | HR 72 | Temp 97.2°F | Resp 14 | Ht 64.0 in | Wt 140.0 lb

## 2013-09-08 DIAGNOSIS — D440 Neoplasm of uncertain behavior of thyroid gland: Secondary | ICD-10-CM

## 2013-09-08 DIAGNOSIS — D449 Neoplasm of uncertain behavior of unspecified endocrine gland: Secondary | ICD-10-CM

## 2013-09-08 DIAGNOSIS — R499 Unspecified voice and resonance disorder: Secondary | ICD-10-CM

## 2013-09-08 DIAGNOSIS — E890 Postprocedural hypothyroidism: Secondary | ICD-10-CM

## 2013-09-08 DIAGNOSIS — E042 Nontoxic multinodular goiter: Secondary | ICD-10-CM

## 2013-09-08 NOTE — Patient Instructions (Signed)
[employer] [place] Do[place] with Honey   COCOA BUTTER & VITAMIN E CREAM  (Palmer's or other brand)  Apply cocoa butter/vitamin E cream to your incision 2 - 3 times daily.  Massage cream into incision for one minute with each application.  Use sunscreen (50 SPF or higher) for first 6 months after surgery if area is exposed to sun.  You may substitute Mederma or other scar reducing creams as desired.

## 2013-09-08 NOTE — Progress Notes (Signed)
General Surgery [facility]  Chief Complaint  Patient presents with  . Follow-up    total thyroidectomy - Sept 2014    HISTORY: Patient is a 49 year old female who underwent total thyroidectomy for lymphocytic thyroiditis in September 2014. She is currently seeing her endocrinologist in an attempt to regulate her thyroid hormone replacement and achieved a normal TSH level. She is currently taking levothyroxin and Cytomel.  Patient notes some mild persistent voice quality changes. She notes a mild raspiness to the voice. Voice volume has been normal. She has had no significant shortness of breath.  PERTINENT REVIEW OF SYSTEMS: Mild voice quality changes. Mild fatigue. Weight gain.  EXAM: HEENT: normocephalic; pupils equal and reactive; sclerae clear; dentition good; mucous membranes moist NECK:  No palpable masses in the thyroid bed; healing surgical incision with slight erythema; symmetric on extension; no palpable anterior or posterior cervical lymphadenopathy; no supraclavicular masses; no tenderness EXT:  non-tender without edema; no deformity NEURO: no gross focal deficits; no sign of tremor  IMPRESSION: #1 status post total thyroidectomy for lymphocytic thyroiditis #2 post surgical hypothyroidism #3 postsurgical voice quality changes  PLAN: Patient and I discussed the above findings at length. She has just started taking Cytomel in addition to her levothyroxine. We discussed the goal of trying to get her TSH level to the low normal range. Given the fact that her TSH level has been in the upper normal range or elevated, I would not pursue the voice quality changes with further diagnostic studies at this time. If the voice quality changes persist after her TSH level has returned to the low normal range, then I would consider referral to the voice disorder clinic at [facility] for evaluation.   Patient will continue to apply topical creams  to her incision and perform massage to the scar.  Patient will return for surgical care as needed.  Earnstine Regal, MD, [facility] Office: [phone]  Visit Diagnoses: 1. Hypothyroidism, postsurgical   2. Multinodular goiter (nontoxic)   3. Neoplasm of uncertain behavior of thyroid gland, left lobe   4. Voice quality disorder

## 2014-12-28 ENCOUNTER — Other Ambulatory Visit: Payer: Self-pay

## 2014-12-28 DIAGNOSIS — Z1231 Encounter for screening mammogram for malignant neoplasm of breast: Secondary | ICD-10-CM

## 2014-12-31 ENCOUNTER — Ambulatory Visit
Admission: RE | Admit: 2014-12-31 | Discharge: 2014-12-31 | Disposition: A | Discharge status: Home / Self Care | Payer: 59 | Source: Ambulatory Visit

## 2014-12-31 DIAGNOSIS — Z1231 Encounter for screening mammogram for malignant neoplasm of breast: Secondary | ICD-10-CM

## 2019-03-11 ENCOUNTER — Other Ambulatory Visit: Payer: Self-pay

## 2019-03-11 ENCOUNTER — Ambulatory Visit (INDEPENDENT_AMBULATORY_CARE_PROVIDER_SITE_OTHER): Payer: 59 | Admitting: Sports Medicine

## 2019-03-11 ENCOUNTER — Ambulatory Visit (INDEPENDENT_AMBULATORY_CARE_PROVIDER_SITE_OTHER): Payer: 59

## 2019-03-11 ENCOUNTER — Encounter: Payer: Self-pay | Admitting: Sports Medicine

## 2019-03-11 VITALS — BP 107/71 | HR 69 | Resp 16

## 2019-03-11 DIAGNOSIS — M25572 Pain in left ankle and joints of left foot: Secondary | ICD-10-CM

## 2019-03-11 DIAGNOSIS — M79672 Pain in left foot: Secondary | ICD-10-CM | POA: Diagnosis not present

## 2019-03-11 DIAGNOSIS — M7662 Achilles tendinitis, left leg: Secondary | ICD-10-CM | POA: Diagnosis not present

## 2019-03-11 DIAGNOSIS — M722 Plantar fascial fibromatosis: Secondary | ICD-10-CM

## 2019-03-11 DIAGNOSIS — M7732 Calcaneal spur, left foot: Secondary | ICD-10-CM

## 2019-03-11 MED ORDER — MELOXICAM 15 MG PO TABS: 15.0000 mg | ORAL_TABLET | Freq: Every day | ORAL | 0 refills | Status: DC

## 2019-03-11 MED ORDER — TRIAMCINOLONE ACETONIDE 10 MG/ML IJ SUSP
10.0000 mg | Freq: Once | INTRAMUSCULAR | Status: AC
Start: 2019-03-11 — End: 2019-03-11
  Administered 2019-03-11: 10 mg

## 2019-03-11 MED ORDER — METHYLPREDNISOLONE 4 MG PO TBPK: ORAL_TABLET | ORAL | 0 refills | Status: DC

## 2019-03-11 NOTE — Patient Instructions (Addendum)
Plantar Fasciitis Rehab Ask your health care provider which exercises are safe for you. Do exercises exactly as told by your health care provider and adjust them as directed. It is normal to feel mild stretching, pulling, tightness, or discomfort as you do these exercises. Stop right away if you feel sudden pain or your pain gets worse. Do not begin these exercises until told by your health care provider. Stretching and range-of-motion exercises These exercises warm up your muscles and joints and improve the movement and flexibility of your foot. These exercises also help to relieve pain. Plantar fascia stretch  1. Sit with your left / right leg crossed over your opposite knee. 2. Hold your heel with one hand with that thumb near your arch. With your other hand, hold your toes and gently pull them back toward the top of your foot. You should feel a stretch on the bottom of your toes or your foot (plantar fascia) or both. 3. Hold this stretch for__________ seconds. 4. Slowly release your toes and return to the starting position. Repeat __________ times. Complete this exercise __________ times a day. Gastrocnemius stretch, standing This exercise is also called a calf (gastroc) stretch. It stretches the muscles in the back of the upper calf. 1. Stand with your hands against a wall. 2. Extend your left / right leg behind you, and bend your front knee slightly. 3. Keeping your heels on the floor and your back knee straight, shift your weight toward the wall. Do not arch your back. You should feel a gentle stretch in your upper left / right calf. 4. Hold this position for __________ seconds. Repeat __________ times. Complete this exercise __________ times a day. Soleus stretch, standing This exercise is also called a calf (soleus) stretch. It stretches the muscles in the back of the lower calf. 1. Stand with your hands against a wall. 2. Extend your left / right leg behind you, and bend your front  knee slightly. 3. Keeping your heels on the floor, bend your back knee and shift your weight slightly over your back leg. You should feel a gentle stretch deep in your lower calf. 4. Hold this position for __________ seconds. Repeat __________ times. Complete this exercise __________ times a day. Gastroc and soleus stretch, standing step This exercise stretches the muscles in the back of the lower leg. These muscles are in the upper calf (gastrocnemius) and the lower calf (soleus). 1. Stand with the ball of your left / right foot on a step. The ball of your foot is on the walking surface, right under your toes. 2. Keep your other foot firmly on the same step. 3. Hold on to the wall or a railing for balance. 4. Slowly lift your other foot, allowing your body weight to press your left / right heel down over the edge of the step. You should feel a stretch in your left / right calf. 5. Hold this position for __________ seconds. 6. Return both feet to the step. 7. Repeat this exercise with a slight bend in your left / right knee. Repeat __________ times with your left / right knee straight and __________ times with your left / right knee bent. Complete this exercise __________ times a day. Balance exercise This exercise builds your balance and strength control of your arch to help take pressure off your plantar fascia. Single leg stand If this exercise is too easy, you can try it with your eyes closed or while standing on a pillow. 1.   Without shoes, stand near a railing or in a doorway. You may hold on to the railing or door frame as needed. 2. Stand on your left / right foot. Keep your big toe down on the floor and try to keep your arch lifted. Do not let your foot roll inward. 3. Hold this position for __________ seconds. Repeat __________ times. Complete this exercise __________ times a day. This information is not intended to replace advice given to you by your health care provider. Make sure  you discuss any questions you have with your health care provider. Document Released: 08/21/2005 Document Revised: 12/12/2018 Document Reviewed: 06/19/2018 Elsevier Patient Education  2020 [employer]  

## 2019-03-11 NOTE — Progress Notes (Signed)
Subjective: Colleen Golden is a 53 y.o. female patient presents to office with complaint of moderate heel pain on the left. Patient admits to post static dyskinesia for 3-4 months in duration. Patient has treated this problem with stretching, exercises, massages with no relief. Denies any other pedal complaints.  Review of Systems  All other systems reviewed and are negative.   Patient Active Problem List   Diagnosis Date Noted  . Voice quality disorder 09/08/2013  . Hypothyroidism, postsurgical 06/18/2013    Current Outpatient Medications on File Prior to Visit  Medication Sig Dispense Refill  . OVER THE COUNTER MEDICATION "DIM" vitamin supplement    . Progesterone Micronized (PROGESTERONE PO) Take by mouth.    Marland Kitchen VITAMIN D PO Take by mouth.    Marland Kitchen amoxicillin (AMOXIL) 500 MG capsule TAKE 4 CAPSULES 1 HOUR BEFORE DENTAL APPT    . ARMOUR THYROID 90 MG tablet TAKE 1 TABLET BY MOUTH EVERY MORNING BEFORE BREAKFAST ON AN EMPTY STOMACH     No current facility-administered medications on file prior to visit.     No Known Allergies  Objective: Physical Exam General: The patient is alert and oriented x3 in no acute distress.  Dermatology: Skin is warm, dry and supple bilateral lower extremities. Nails 1-10 are normal. There is no erythema, edema, no eccymosis, no open lesions present. Integument is otherwise unremarkable.  Vascular: Dorsalis Pedis pulse and Posterior Tibial pulse are 2/4 bilateral. Capillary fill time is immediate to all digits.  Neurological: Grossly intact to light touch with an achilles reflex of +2/5 and a  negative Tinel's sign bilateral.  Musculoskeletal: Tenderness to palpation at the medial calcaneal tubercale and through the insertion of the plantar fascia on the left foot. No pain with compression of calcaneus bilateral. No pain with tuning fork to calcaneus bilateral. No pain with calf compression bilateral. There is decreased Ankle joint range of motion  bilateral. All other joints range of motion within normal limits bilateral. Strength 5/5 in all groups bilateral.   Gait: Unassisted, Antalgic avoid weight on left heel  Xray, Left foot:  Normal osseous mineralization. Joint spaces preserved. No fracture/dislocation/boney destruction. Calcaneal spur present with mild thickening of plantar fascia. No other soft tissue abnormalities or radiopaque foreign bodies.   Assessment and Plan: Problem List Items Addressed This Visit    None    Visit Diagnoses    Plantar fasciitis of left foot    -  Primary   Relevant Medications   triamcinolone acetonide (KENALOG) 10 MG/ML injection 10 mg (Start on 03/11/2019  9:00 AM)   meloxicam (MOBIC) 15 MG tablet   methylPREDNISolone (MEDROL DOSEPAK) 4 MG TBPK tablet   Other Relevant Orders   DG Foot Complete Left (Completed)   Left foot pain       Left ankle pain, unspecified chronicity       Heel spur, left          -Complete examination performed.  -Xrays reviewed -Discussed with patient in detail the condition of plantar fasciitis, how this occurs and general treatment options. Explained both conservative and surgical treatments.  -After oral consent and aseptic prep, injected a mixture containing 1 ml of 2%  plain lidocaine, 1 ml 0.5% plain marcaine, 0.5 ml of kenalog 10 and 0.5 ml of dexamethasone phosphate into left heel. Post-injection care discussed with patient.  -Rx Meloxicam to start after Medrol dose pack is completed -Recommended good supportive shoes and advised use of OTC insert. Explained to patient that if these  orthoses work well, we will continue with these. If these do not improve her condition and  pain, we will consider custom molded orthoses. -Explained in detail the use of the fascial brace for the left which was dispensed at today's visit. -Explained and dispensed to patient daily stretching exercises. -Recommend patient to ice affected area 1-2x daily. -Patient to return to  office in 3-4 weeks for follow up or sooner if problems or questions arise.  Landis Martins, DPM

## 2019-04-03 ENCOUNTER — Other Ambulatory Visit: Payer: Self-pay | Admitting: Sports Medicine

## 2019-04-03 DIAGNOSIS — M722 Plantar fascial fibromatosis: Secondary | ICD-10-CM

## 2019-04-15 ENCOUNTER — Other Ambulatory Visit: Payer: Self-pay

## 2019-04-15 ENCOUNTER — Ambulatory Visit: Payer: 59 | Admitting: Sports Medicine

## 2019-04-15 ENCOUNTER — Ambulatory Visit (INDEPENDENT_AMBULATORY_CARE_PROVIDER_SITE_OTHER): Payer: 59 | Admitting: Sports Medicine

## 2019-04-15 ENCOUNTER — Encounter: Payer: Self-pay | Admitting: Sports Medicine

## 2019-04-15 VITALS — Temp 97.3°F

## 2019-04-15 DIAGNOSIS — M722 Plantar fascial fibromatosis: Secondary | ICD-10-CM

## 2019-04-15 DIAGNOSIS — M79672 Pain in left foot: Secondary | ICD-10-CM

## 2019-04-15 MED ORDER — TRIAMCINOLONE ACETONIDE 40 MG/ML IJ SUSP
20.0000 mg | Freq: Once | INTRAMUSCULAR | Status: AC
  Administered 2019-04-15: 14:00:00 20 mg

## 2019-04-15 NOTE — Progress Notes (Signed)
Subjective: Colleen Golden is a 53 y.o. female returns to office for follow up evaluation after left heel injection for plantar fasciitis patient reports that there is no pain in her arch but still continuous pain in her left heel.  Patient states that the last injection only helped for about 1 to 2 days states that she did not take any of her oral medication however has been consistent with stretching icing and wearing plantar fascial brace.  Patient reports that her brace helps and reports that it is hard for her to stay off her foot because she is currently unpacking from her move.  Patient reports that there is increased pain with activity like exercise so she has stopped walking for exercise. Patient denies any recent changes in medications or new problems since last visit.   Patient Active Problem List   Diagnosis Date Noted  . Voice quality disorder 09/08/2013  . Hypothyroidism, postsurgical 06/18/2013    Current Outpatient Medications on File Prior to Visit  Medication Sig Dispense Refill  . amoxicillin (AMOXIL) 500 MG capsule TAKE 4 CAPSULES 1 HOUR BEFORE DENTAL APPT    . ARMOUR THYROID 90 MG tablet TAKE 1 TABLET BY MOUTH EVERY MORNING BEFORE BREAKFAST ON AN EMPTY STOMACH    . meloxicam (MOBIC) 15 MG tablet Take 1 tablet (15 mg total) by mouth daily. 30 tablet 0  . methylPREDNISolone (MEDROL DOSEPAK) 4 MG TBPK tablet Take as directed 21 tablet 0  . OVER THE COUNTER MEDICATION "DIM" vitamin supplement    . Progesterone Micronized (PROGESTERONE PO) Take by mouth.    Marland Kitchen VITAMIN D PO Take by mouth.     No current facility-administered medications on file prior to visit.     No Known Allergies  Objective:   General:  Alert and oriented x 3, in no acute distress  Dermatology: Skin is warm, dry, and supple bilateral. Nails are within normal limits. There is no lower extremity erythema, no eccymosis, no open lesions present bilateral.   Vascular: Dorsalis Pedis and Posterior Tibial  pedal pulses are 2/4 bilateral. + hair growth noted bilateral. Capillary Fill Time is 3 seconds in all digits. No varicosities, No edema bilateral lower extremities.   Neurological: Sensation grossly intact to light touch with an achilles reflex of +2 and a  negative Tinel's sign bilateral. Vibratory, sharp/dull, Semmes Weinstein Monofilament within normal limits.   Musculoskeletal: There is no tenderness to the left arch however there is still tenderness to palpation at the medial calcaneal tubercale and through the insertion of the plantar fascia on the Left foot. No pain with compression to calcaneus or application of tuning fork. There is decreased Ankle joint range of motion bilateral. All other joints range of motion  within normal limits bilateral. Strength 5/5 bilateral.   Assessment and Plan: Problem List Items Addressed This Visit    None    Visit Diagnoses    Plantar fasciitis of left foot    -  Primary   Relevant Medications   triamcinolone acetonide (KENALOG-40) injection 20 mg (Completed) (Start on 04/15/2019  2:15 PM)   Left foot pain       Relevant Medications   triamcinolone acetonide (KENALOG-40) injection 20 mg (Completed) (Start on 04/15/2019  2:15 PM)      -Complete examination performed.  -Previous x-rays reviewed. -Discussed with patient in detail the condition of plantar fasciitis, how this  occurs related to the foot type of the patient and general treatment options. - Patient opted for another  injection today; After oral consent and aseptic prep, injected a mixture containing 1 ml of 1%plain lidocaine, 1 ml 0.5% plain marcaine, 0.5 ml of kenalog 40 and 0.5 ml of dexmethasone phosphate to left heel at area of most pain/trigger point injection. -Advised patient to take oral medications as I have prescribed to help with this pain encourage patient to at least take a prednisone pack and after that may consider taking her meloxicam of which she already has -Dispensed  heel lifts -Continue with stretching, icing, good supportive shoes, fascial brace daily.  -Discussed long term care and reocurrence; will closely monitor; if fails to improve will consider other treatment modalities.  -Patient to return to office in 1 month for follow up or sooner if problems or questions arise.  Landis Martins, DPM

## 2019-04-23 NOTE — Telephone Encounter (Signed)
Called in Meloxicam prescription to  [facility].

## 2019-05-20 ENCOUNTER — Ambulatory Visit: Payer: Self-pay | Admitting: Sports Medicine

## 2019-06-19 ENCOUNTER — Other Ambulatory Visit: Payer: Self-pay | Admitting: Sports Medicine

## 2019-06-19 DIAGNOSIS — M722 Plantar fascial fibromatosis: Secondary | ICD-10-CM

## 2019-06-19 NOTE — Telephone Encounter (Signed)
Dr. Cannon Kettle  Please advice

## 2019-12-26 ENCOUNTER — Other Ambulatory Visit: Payer: Self-pay | Admitting: Family Medicine

## 2019-12-26 DIAGNOSIS — Z13820 Encounter for screening for osteoporosis: Secondary | ICD-10-CM

## 2019-12-31 ENCOUNTER — Ambulatory Visit
Admission: RE | Admit: 2019-12-31 | Discharge: 2019-12-31 | Disposition: A | Discharge status: Home / Self Care | Payer: 59 | Source: Ambulatory Visit | Attending: Family Medicine | Admitting: Family Medicine

## 2019-12-31 ENCOUNTER — Other Ambulatory Visit: Payer: Self-pay

## 2019-12-31 DIAGNOSIS — Z13820 Encounter for screening for osteoporosis: Secondary | ICD-10-CM

## 2020-01-13 ENCOUNTER — Other Ambulatory Visit: Payer: Self-pay | Admitting: Obstetrics and Gynecology

## 2020-01-13 DIAGNOSIS — Z9189 Other specified personal risk factors, not elsewhere classified: Secondary | ICD-10-CM

## 2020-02-12 ENCOUNTER — Ambulatory Visit
Admission: RE | Admit: 2020-02-12 | Discharge: 2020-02-12 | Disposition: A | Discharge status: Home / Self Care | Payer: Managed Care, Other (non HMO) | Source: Ambulatory Visit | Attending: Obstetrics and Gynecology | Admitting: Obstetrics and Gynecology

## 2020-02-12 ENCOUNTER — Other Ambulatory Visit: Payer: Self-pay

## 2020-02-12 DIAGNOSIS — Z9189 Other specified personal risk factors, not elsewhere classified: Secondary | ICD-10-CM

## 2020-02-12 MED ORDER — GADOBUTROL 1 MMOL/ML IV SOLN
6.0000 mL | Freq: Once | INTRAVENOUS | Status: AC | PRN
  Administered 2020-02-12: 6 mL via INTRAVENOUS

## 2020-02-17 ENCOUNTER — Other Ambulatory Visit: Payer: Self-pay | Admitting: Obstetrics and Gynecology

## 2020-02-17 DIAGNOSIS — R9389 Abnormal findings on diagnostic imaging of other specified body structures: Secondary | ICD-10-CM

## 2020-03-01 ENCOUNTER — Ambulatory Visit
Admission: RE | Admit: 2020-03-01 | Discharge: 2020-03-01 | Disposition: A | Discharge status: Home / Self Care | Payer: Managed Care, Other (non HMO) | Source: Ambulatory Visit | Attending: Obstetrics and Gynecology | Admitting: Obstetrics and Gynecology

## 2020-03-01 ENCOUNTER — Other Ambulatory Visit: Payer: Self-pay

## 2020-03-01 DIAGNOSIS — R9389 Abnormal findings on diagnostic imaging of other specified body structures: Secondary | ICD-10-CM

## 2020-03-02 ENCOUNTER — Other Ambulatory Visit: Payer: Self-pay | Admitting: Obstetrics and Gynecology

## 2020-03-02 DIAGNOSIS — R928 Other abnormal and inconclusive findings on diagnostic imaging of breast: Secondary | ICD-10-CM

## 2020-03-09 ENCOUNTER — Other Ambulatory Visit: Payer: Self-pay | Admitting: Obstetrics and Gynecology

## 2020-03-09 ENCOUNTER — Other Ambulatory Visit: Payer: Self-pay

## 2020-03-09 ENCOUNTER — Ambulatory Visit: Payer: Managed Care, Other (non HMO)

## 2020-03-09 ENCOUNTER — Ambulatory Visit
Admission: RE | Admit: 2020-03-09 | Discharge: 2020-03-09 | Disposition: A | Discharge status: Home / Self Care | Payer: Managed Care, Other (non HMO) | Source: Ambulatory Visit | Attending: Obstetrics and Gynecology | Admitting: Obstetrics and Gynecology

## 2020-03-09 DIAGNOSIS — R928 Other abnormal and inconclusive findings on diagnostic imaging of breast: Secondary | ICD-10-CM

## 2020-03-09 MED ORDER — GADOBUTROL 1 MMOL/ML IV SOLN
6.0000 mL | Freq: Once | INTRAVENOUS | Status: AC | PRN
  Administered 2020-03-09: 6 mL via INTRAVENOUS

## 2020-03-19 ENCOUNTER — Other Ambulatory Visit: Payer: Self-pay

## 2020-03-19 ENCOUNTER — Ambulatory Visit
Admission: RE | Admit: 2020-03-19 | Discharge: 2020-03-19 | Disposition: A | Discharge status: Home / Self Care | Payer: Managed Care, Other (non HMO) | Source: Ambulatory Visit | Attending: Obstetrics and Gynecology | Admitting: Obstetrics and Gynecology

## 2020-03-19 DIAGNOSIS — R928 Other abnormal and inconclusive findings on diagnostic imaging of breast: Secondary | ICD-10-CM

## 2020-03-19 MED ORDER — GADOBUTROL 1 MMOL/ML IV SOLN: 6.0000 mL | Freq: Once | INTRAVENOUS | Status: DC | PRN

## 2020-10-13 ENCOUNTER — Other Ambulatory Visit: Payer: Self-pay | Admitting: Obstetrics and Gynecology

## 2020-10-13 DIAGNOSIS — N6099 Unspecified benign mammary dysplasia of unspecified breast: Secondary | ICD-10-CM

## 2020-10-19 ENCOUNTER — Other Ambulatory Visit: Payer: Managed Care, Other (non HMO)

## 2020-10-19 ENCOUNTER — Encounter: Payer: Self-pay | Admitting: Internal Medicine

## 2020-11-25 ENCOUNTER — Other Ambulatory Visit: Payer: Self-pay

## 2020-11-25 ENCOUNTER — Ambulatory Visit (AMBULATORY_SURGERY_CENTER): Payer: Self-pay | Admitting: *Deleted

## 2020-11-25 VITALS — Ht 64.0 in | Wt 110.0 lb

## 2020-11-25 DIAGNOSIS — Z1211 Encounter for screening for malignant neoplasm of colon: Secondary | ICD-10-CM

## 2020-11-25 MED ORDER — NA SULFATE-K SULFATE-MG SULF 17.5-3.13-1.6 GM/177ML PO SOLN: 1.0000 | Freq: Once | ORAL | 0 refills | Status: AC

## 2020-11-25 NOTE — Progress Notes (Signed)
No egg or soy allergy known to patient  No issues with past sedation with any surgeries or procedures Patient denies ever being told they had issues or difficulty with intubation  No FH of Malignant Hyperthermia No diet pills per patient No home 02 use per patient  No blood thinners per patient  Pt denies issues with constipation  No A fib or A flutter  EMMI video to pt or via MyChart  COVID 19 guidelines implemented in PV today with Pt and RN    Suprep Coupon given to pt in PV today , Code to Pharmacy and  NO PA's for preps discussed with pt In PV today  Discussed with pt there will be an out-of-pocket cost for prep and that varies from $0 to 70 dollars   Due to the COVID-19 pandemic we are asking patients to follow certain guidelines.  Pt aware of COVID protocols and [facility] guidelines

## 2020-12-07 ENCOUNTER — Other Ambulatory Visit: Payer: Self-pay

## 2020-12-07 ENCOUNTER — Ambulatory Visit (AMBULATORY_SURGERY_CENTER): Payer: Managed Care, Other (non HMO) | Admitting: Internal Medicine

## 2020-12-07 ENCOUNTER — Encounter: Payer: Self-pay | Admitting: Internal Medicine

## 2020-12-07 VITALS — BP 113/80 | HR 68 | Temp 97.3°F | Resp 19 | Ht 64.0 in | Wt 110.0 lb

## 2020-12-07 DIAGNOSIS — Z1211 Encounter for screening for malignant neoplasm of colon: Secondary | ICD-10-CM | POA: Diagnosis present

## 2020-12-07 MED ORDER — SODIUM CHLORIDE 0.9 % IV SOLN: 500.0000 mL | Freq: Once | INTRAVENOUS | Status: DC

## 2020-12-07 NOTE — Op Note (Signed)
[name]: Colleen Golden Procedure Date: 12/07/2020 9:34 AM MRN: [id number] Endoscopist: Jerene Bears , MD Age: 55 Referring MD:  Date of Birth: [birth year] Gender: Female Account #: [id number] Procedure:                Colonoscopy Indications:              Screening for colorectal malignant neoplasm, This                            is the patient's first colonoscopy Medicines:                Monitored Anesthesia Care Procedure:                Pre-Anesthesia Assessment:                           - Prior to the procedure, a History and Physical                            was performed, and patient medications and                            allergies were reviewed. The patient's tolerance of                            previous anesthesia was also reviewed. The risks                            and benefits of the procedure and the sedation                            options and risks were discussed with the patient.                            All questions were answered, and informed consent                            was obtained. Prior Anticoagulants: The patient has                            taken no previous anticoagulant or antiplatelet                            agents. ASA Grade Assessment: II - A patient with                            mild systemic disease. After reviewing the risks                            and benefits, the patient was deemed in                            satisfactory condition to undergo the procedure.  After obtaining informed consent, the colonoscope                            was passed under direct vision. Throughout the                            procedure, the patient's blood pressure, pulse, and                            oxygen saturations were monitored continuously. The                            Olympus PCF-H190DL (#[id number]) Colonoscope was                            introduced through the anus and  advanced to the                            cecum, identified by appendiceal orifice and                            ileocecal valve. The colonoscopy was performed                            without difficulty. The patient tolerated the                            procedure well. The quality of the bowel                            preparation was good. The ileocecal valve,                            appendiceal orifice, and rectum were photographed. Scope In: 9:49:32 AM Scope Out: 10:07:42 AM Scope Withdrawal Time: 0 hours 12 minutes 44 seconds  Total Procedure Duration: 0 hours 18 minutes 10 seconds  Findings:                 The digital rectal exam was normal.                           The entire examined colon appeared normal on direct                            and retroflexion views. Complications:            No immediate complications. Estimated Blood Loss:     Estimated blood loss: none. Impression:               - The entire examined colon is normal on direct and                            retroflexion views.                           - No specimens collected. Recommendation:           -  Patient has a contact number available for                            emergencies. The signs and symptoms of potential                            delayed complications were discussed with the                            patient. Return to normal activities tomorrow.                            Written discharge instructions were provided to the                            patient.                           - Resume previous diet.                           - Continue present medications.                           - Repeat colonoscopy in 10 years for screening                            purposes. Jerene Bears, MD 12/07/2020 10:11:23 AM This report has been signed electronically.

## 2020-12-07 NOTE — Progress Notes (Signed)
PT taken to PACU. Monitors in place. VSS. Report given to RN. 

## 2020-12-07 NOTE — Patient Instructions (Signed)
Please read handouts provided. Continue present medications. Repeat colonoscopy in 10 years for screening purposes.     YOU HAD AN ENDOSCOPIC PROCEDURE TODAY AT [facility]:   Refer to the procedure report that was given to you for any specific questions about what was found during the examination.  If the procedure report does not answer your questions, please call your gastroenterologist to clarify.  If you requested that your care partner not be given the details of your procedure findings, then the procedure report has been included in a sealed envelope for you to review at your convenience later.  YOU SHOULD EXPECT: Some feelings of bloating in the abdomen. Passage of more gas than usual.  Walking can help get rid of the air that was put into your GI tract during the procedure and reduce the bloating. If you had a lower endoscopy (such as a colonoscopy or flexible sigmoidoscopy) you may notice spotting of blood in your stool or on the toilet paper. If you underwent a bowel prep for your procedure, you may not have a normal bowel movement for a few days.  Please Note:  You might notice some irritation and congestion in your nose or some drainage.  This is from the oxygen used during your procedure.  There is no need for concern and it should clear up in a day or so.  SYMPTOMS TO REPORT IMMEDIATELY:   Following lower endoscopy (colonoscopy or flexible sigmoidoscopy):  Excessive amounts of blood in the stool  Significant tenderness or worsening of abdominal pains  Swelling of the abdomen that is new, acute  Fever of 100F or higher   For urgent or emergent issues, a gastroenterologist can be reached at any hour by calling [phone]. Do not use MyChart messaging for urgent concerns.    DIET:  We do recommend a small meal at first, but then you may proceed to your regular diet.  Drink plenty of fluids but you should avoid alcoholic beverages for 24 hours.  ACTIVITY:   You should plan to take it easy for the rest of today and you should NOT DRIVE or use heavy machinery until tomorrow (because of the sedation medicines used during the test).    FOLLOW UP: Our staff will call the number listed on your records 48-72 hours following your procedure to check on you and address any questions or concerns that you may have regarding the information given to you following your procedure. If we do not reach you, we will leave a message.  We will attempt to reach you two times.  During this call, we will ask if you have developed any symptoms of COVID 19. If you develop any symptoms (ie: fever, flu-like symptoms, shortness of breath, cough etc.) before then, please call [phone].  If you test positive for Covid 19 in the 2 weeks post procedure, please call and report this information to [place].    If any biopsies were taken you will be contacted by phone or by letter within the next 1-3 weeks.  Please call us at [phone] if you have not heard about the biopsies in 3 weeks.    SIGNATURES/CONFIDENTIALITY: You and/or your care partner have signed paperwork which will be entered into your electronic medical record.  These signatures attest to the fact that that the information above on your After Visit Summary has been reviewed and is understood.  Full responsibility of the confidentiality of this discharge information lies with you and/or your  care-partner.

## 2020-12-07 NOTE — Progress Notes (Signed)
Informed Colleen Golden ,CRNA and Dr Hilarie Fredrickson of pt's Blood sugar 75 . No D5w started as ordered IV Ns infusing KVO.Pt remains asymptomatic and does not feel as blood sugar is low .

## 2020-12-07 NOTE — Progress Notes (Signed)
Pt's states no medical or surgical changes since previsit or office visit.   Vs by CW in adm 

## 2020-12-09 ENCOUNTER — Telehealth: Payer: Self-pay | Admitting: *Deleted

## 2020-12-09 NOTE — Telephone Encounter (Signed)
  Follow up Call-  Call back number 12/07/2020  Post procedure Call Back phone  # [phone]  Permission to leave phone message Yes  Some recent data might be hidden     First attempt for follow up phone call. No answer at number given.  Left message on voicemail.   Only 1 attempt made to reach patient d/t phone outage.

## 2021-04-29 IMAGING — MR MR BREAST BILAT WO/W CM
7 of 10 series · 31 of 48 positions shown · IV contrast (6ml Gadavist)
Comparison: BILATERAL Breast MRI 02/17/2013. Multiple prior
mammograms, most recently 01/12/2020.

CLINICAL DATA: 53-year-old with strong family history of breast
cancer in her mother diagnosed at age 67 and in her maternal
grandmother diagnosed at age 42. She has extremely dense breasts on
mammography. High risk screening examination.

LABS:  Not applicable.
EXAM:
BILATERAL BREAST MRI WITH AND WITHOUT CONTRAST
TECHNIQUE: Multiplanar, multisequence MR images of both breasts were obtained
prior to and following the intravenous administration of 6 ml of
Gadavist.

[Series 2: t2_tirm_tra ipat (a-p) · axial · 3.0mm · 0.70mm/px · z∈[-99,+63]mm · 3 of 55 slices shown]
[im 1/55]
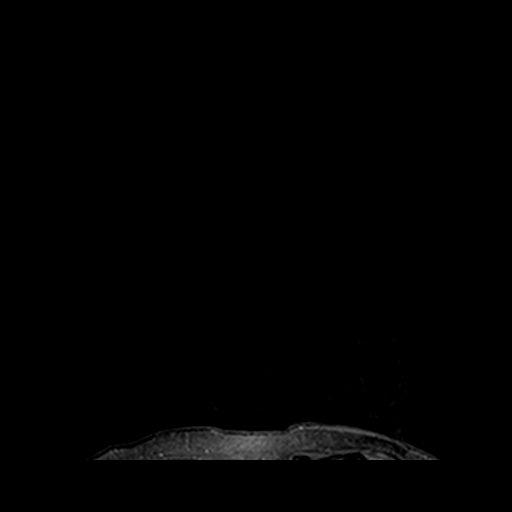
[im 28/55]
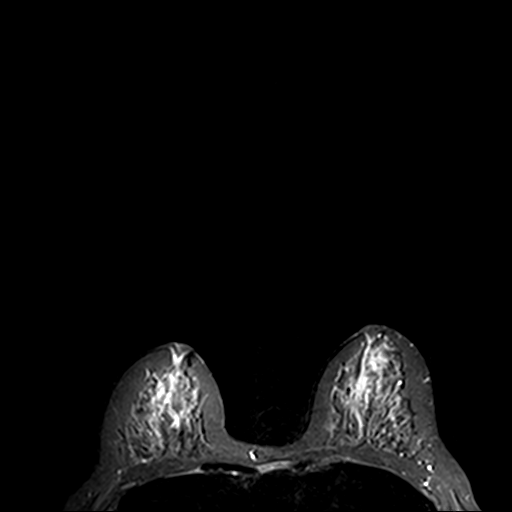
[im 55/55]
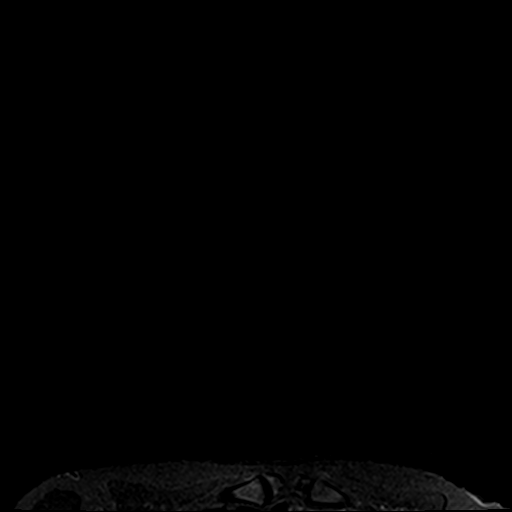

[Series 3: fl3d pre-cm no · axial · non-contrast · 1.2mm · 0.94mm/px · z∈[-104,+67]mm · 7 of 144 slices shown]
[im 1/144]
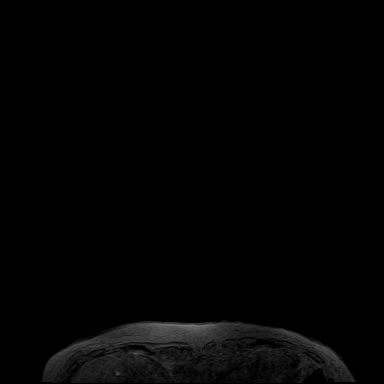
[im 24/144]
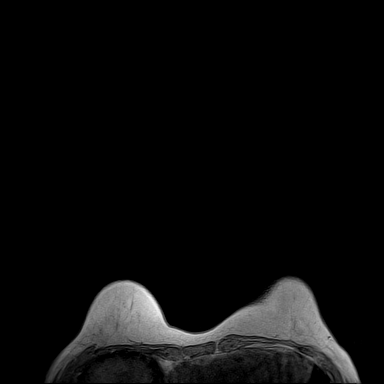
[im 48/144]
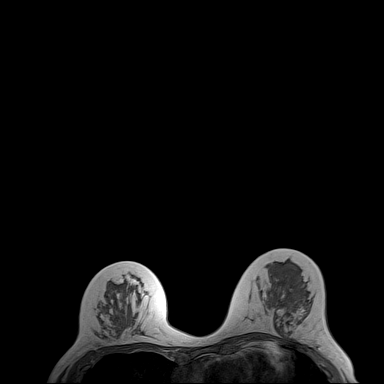
[im 72/144]
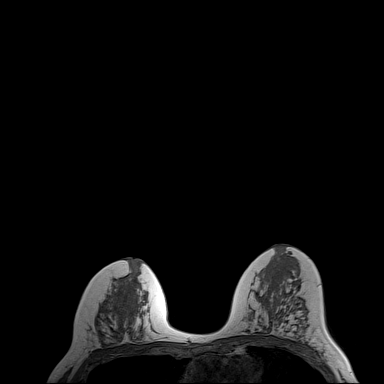
[im 96/144]
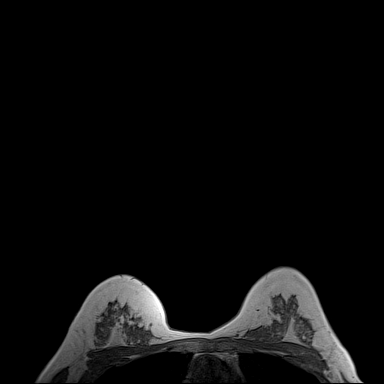
[im 120/144]
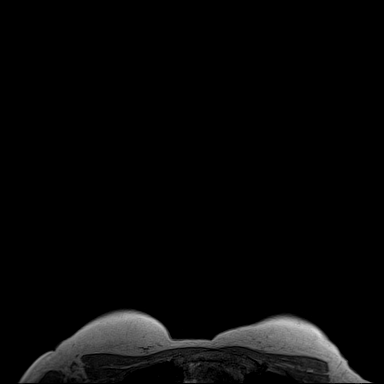
[im 144/144]
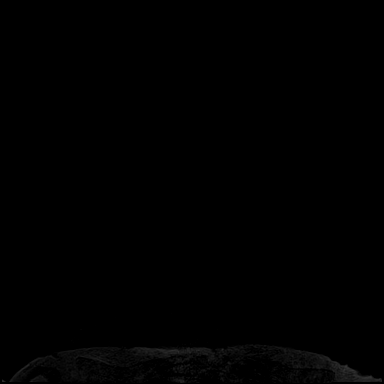

[Series 4: fl3d pre-cm · axial · non-contrast · 1.2mm · 0.94mm/px · z∈[-104,+67]mm · 6 of 144 slices shown]
[im 1/144]
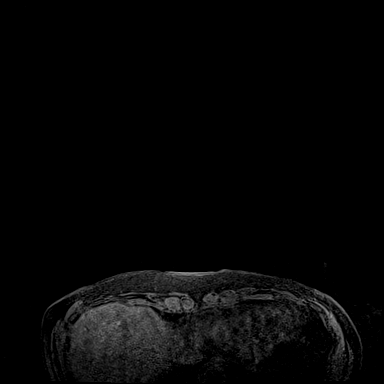
[im 29/144]
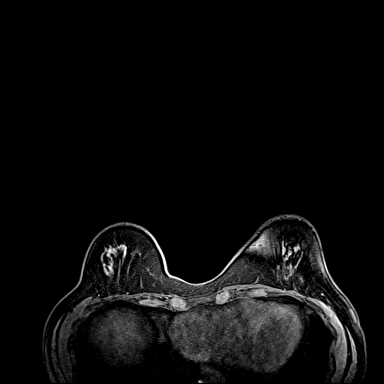
[im 58/144]
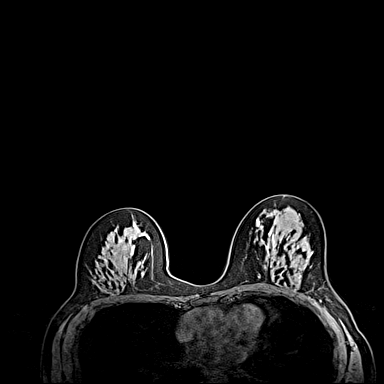
[im 86/144]
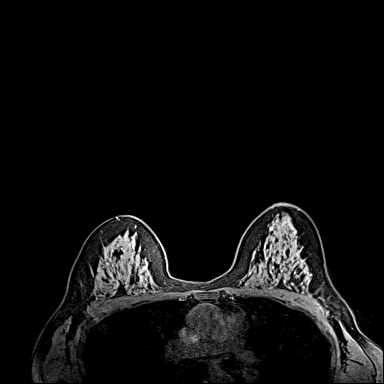
[im 115/144]
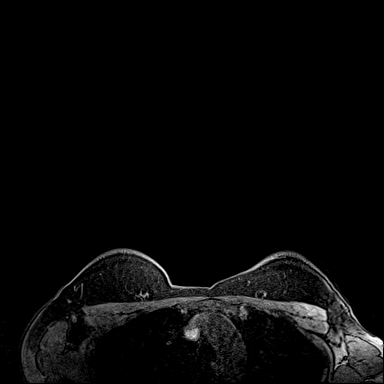
[im 144/144]
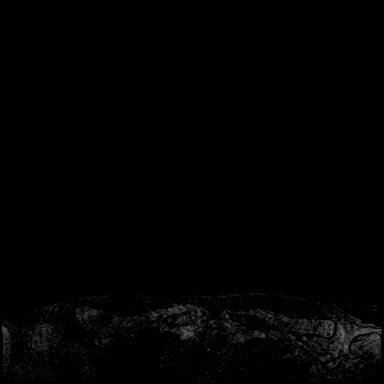

[Series 5: fl3d post immediate · axial · 1.2mm · 0.94mm/px · z∈[-104,+67]mm · 6 of 144 slices shown (1 of 3)]
[im 1/144]
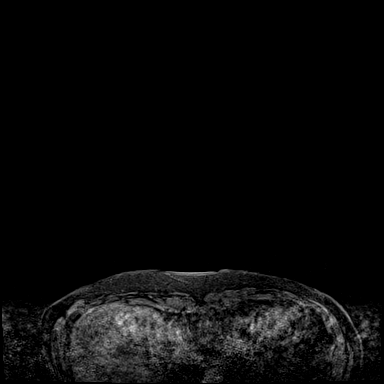
[im 29/144]
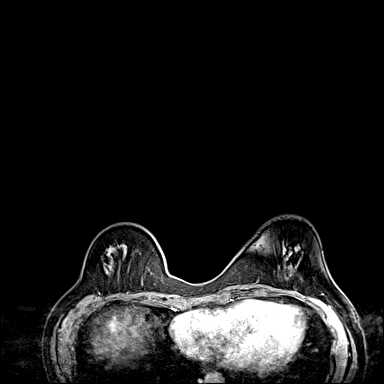
[im 58/144]
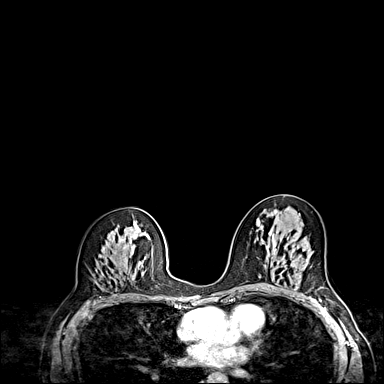
[im 86/144]
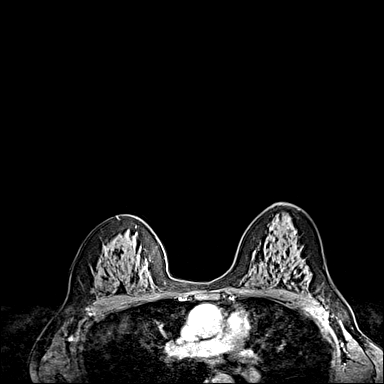
[im 115/144]
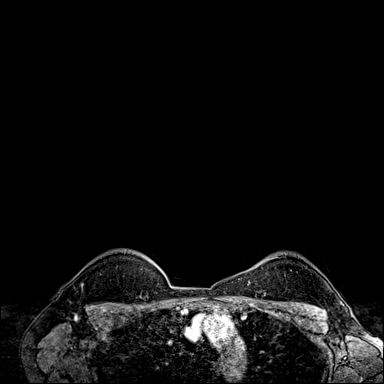
[im 144/144]
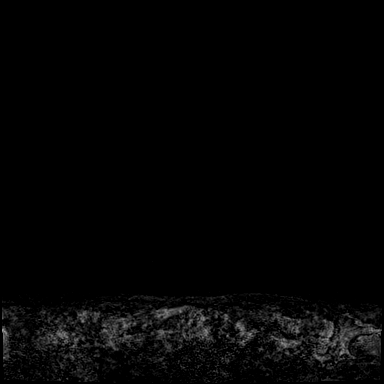

[Series 6: fl3d post immediate · axial · 1.2mm · 0.94mm/px · z∈[-104,+67]mm · 6 of 144 slices shown (2 of 3)]
[im 1/144]
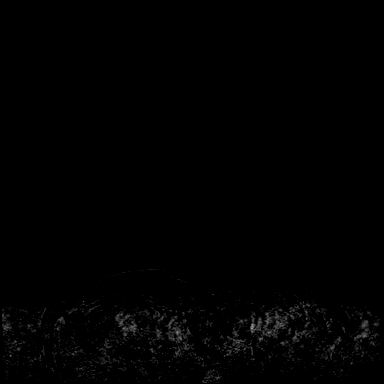
[im 29/144]
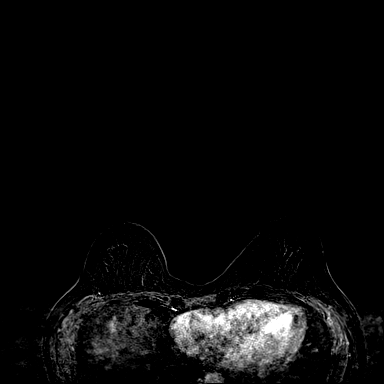
[im 58/144]
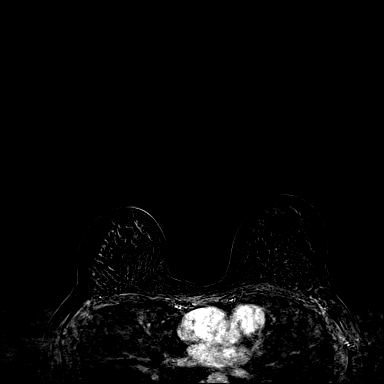
[im 86/144]
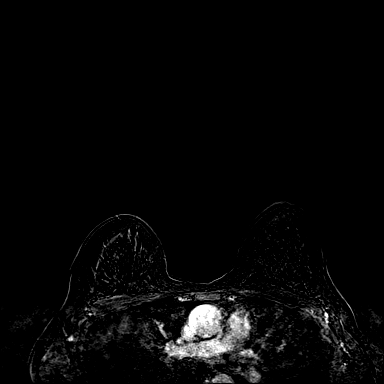
[im 115/144]
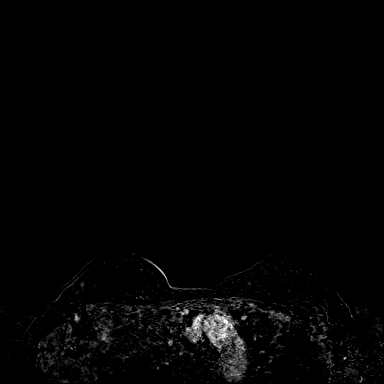
[im 144/144]
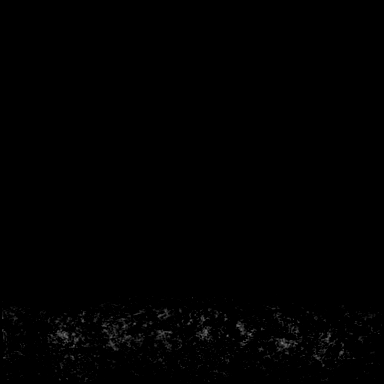

[Series 7: fl3d post immediate · axial · 172.8mm · 0.94mm/px · 1 of 1 slices shown (3 of 3)]
[im 1/1]
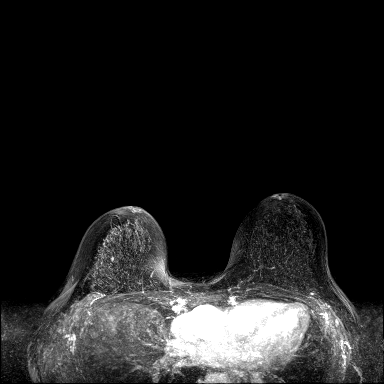

[Series 8: fl3d post 3min · axial · 1.2mm · 0.94mm/px · z∈[-104,-71]mm · 2 of 144 slices shown]
[im 1/144]
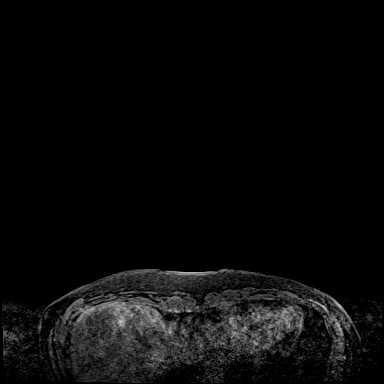
[im 29/144]
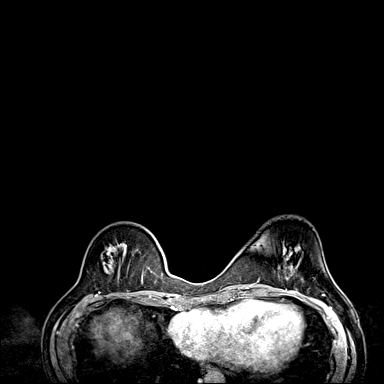

[31 of 48 positions shown; findings below may reference images not displayed]

Three-dimensional MR images were rendered by post-processing of the
original MR data on an independent workstation. The
three-dimensional MR images were interpreted, and findings are
reported in the following complete MRI report for this study. Three
dimensional images were evaluated using the DynaCad thin client on
the independent workstation used by the radiologist for image
interpretation.
FINDINGS: Breast composition: d. Extreme fibroglandular tissue.

Background parenchymal enhancement: Moderate.

Right breast: 5 mm enhancing mass in the LOWER OUTER QUADRANT at
MIDDLE depth with benign progressive enhancement kinetics, not
present on the prior MRI in 4018. No mass or abnormal enhancement
otherwise.

Left breast: No suspicious mass or abnormal enhancement.

Lymph nodes: No pathologic lymphadenopathy.

Ancillary findings:  None.
IMPRESSION: 1. Likely benign 5 mm mass involving the LOWER OUTER QUADRANT of the
RIGHT breast at MIDDLE depth. The mass was not present on the MRI in
4018, but has benign enhancement kinetics.
2. No MRI evidence of malignancy involving the LEFT breast.

RECOMMENDATION:
Second-look ultrasound of the LOWER OUTER QUADRANT of the RIGHT
breast. If the mass is not identified on the second-look ultrasound,
then six-month follow-up MRI would be recommended.

BI-RADS CATEGORY  3: Probably benign.

## 2021-06-09 ENCOUNTER — Other Ambulatory Visit: Payer: Self-pay | Admitting: Obstetrics and Gynecology

## 2021-06-09 DIAGNOSIS — Z9189 Other specified personal risk factors, not elsewhere classified: Secondary | ICD-10-CM

## 2022-07-07 ENCOUNTER — Other Ambulatory Visit: Payer: Self-pay | Admitting: Obstetrics and Gynecology

## 2022-07-07 DIAGNOSIS — Z9189 Other specified personal risk factors, not elsewhere classified: Secondary | ICD-10-CM

## 2023-07-27 ENCOUNTER — Other Ambulatory Visit: Payer: Self-pay | Admitting: Obstetrics and Gynecology

## 2023-07-27 DIAGNOSIS — R928 Other abnormal and inconclusive findings on diagnostic imaging of breast: Secondary | ICD-10-CM

## 2023-07-27 DIAGNOSIS — Z803 Family history of malignant neoplasm of breast: Secondary | ICD-10-CM

## 2023-08-10 ENCOUNTER — Other Ambulatory Visit: Payer: Self-pay | Admitting: Obstetrics and Gynecology

## 2023-08-10 DIAGNOSIS — N63 Unspecified lump in unspecified breast: Secondary | ICD-10-CM

## 2023-09-10 ENCOUNTER — Other Ambulatory Visit: Payer: Managed Care, Other (non HMO)

## 2023-09-12 ENCOUNTER — Encounter: Payer: Self-pay | Admitting: Obstetrics and Gynecology

## 2023-09-18 ENCOUNTER — Other Ambulatory Visit: Payer: Managed Care, Other (non HMO)

## 2023-09-18 ENCOUNTER — Ambulatory Visit
Admission: RE | Admit: 2023-09-18 | Discharge: 2023-09-18 | Disposition: A | Discharge status: Home / Self Care | Payer: Managed Care, Other (non HMO) | Source: Ambulatory Visit | Attending: Obstetrics and Gynecology | Admitting: Obstetrics and Gynecology

## 2023-09-18 DIAGNOSIS — Z803 Family history of malignant neoplasm of breast: Secondary | ICD-10-CM

## 2023-09-18 MED ORDER — GADOPICLENOL 0.5 MMOL/ML IV SOLN
5.0000 mL | Freq: Once | INTRAVENOUS | Status: AC | PRN
  Administered 2023-09-18: 5 mL via INTRAVENOUS

## 2023-09-19 ENCOUNTER — Other Ambulatory Visit: Payer: Self-pay | Admitting: Obstetrics and Gynecology

## 2023-09-19 DIAGNOSIS — N6310 Unspecified lump in the right breast, unspecified quadrant: Secondary | ICD-10-CM

## 2023-09-21 ENCOUNTER — Ambulatory Visit
Admission: RE | Admit: 2023-09-21 | Discharge: 2023-09-21 | Disposition: A | Discharge status: Home / Self Care | Payer: Managed Care, Other (non HMO) | Source: Ambulatory Visit | Attending: Obstetrics and Gynecology | Admitting: Obstetrics and Gynecology

## 2023-09-21 DIAGNOSIS — N6310 Unspecified lump in the right breast, unspecified quadrant: Secondary | ICD-10-CM

## 2023-09-21 HISTORY — PX: BREAST BIOPSY: SHX20

## 2023-09-25 ENCOUNTER — Other Ambulatory Visit: Payer: Self-pay | Admitting: Obstetrics and Gynecology

## 2023-09-25 DIAGNOSIS — R928 Other abnormal and inconclusive findings on diagnostic imaging of breast: Secondary | ICD-10-CM

## 2023-09-25 LAB — SURGICAL PATHOLOGY

## 2023-09-26 ENCOUNTER — Telehealth: Payer: Self-pay | Admitting: *Deleted

## 2023-09-26 ENCOUNTER — Ambulatory Visit
Admission: RE | Admit: 2023-09-26 | Discharge: 2023-09-26 | Disposition: A | Discharge status: Home / Self Care | Payer: Managed Care, Other (non HMO) | Source: Ambulatory Visit | Attending: Obstetrics and Gynecology | Admitting: Obstetrics and Gynecology

## 2023-09-26 ENCOUNTER — Ambulatory Visit
Admission: RE | Admit: 2023-09-26 | Discharge: 2023-09-26 | Discharge status: Home / Self Care | Payer: Managed Care, Other (non HMO) | Source: Ambulatory Visit | Attending: Obstetrics and Gynecology

## 2023-09-26 DIAGNOSIS — R928 Other abnormal and inconclusive findings on diagnostic imaging of breast: Secondary | ICD-10-CM

## 2023-09-26 MED ORDER — GADOPICLENOL 0.5 MMOL/ML IV SOLN
5.0000 mL | Freq: Once | INTRAVENOUS | Status: AC | PRN
  Administered 2023-09-26: 5 mL via INTRAVENOUS

## 2023-09-26 NOTE — Telephone Encounter (Signed)
Spoke to patient to confirm upcoming morning [facility] clinic appointment on 1/29, paperwork will be sent via e-mail.  Gave location and time, also informed patient that the surgeon's office would be calling as well to get information from them similar to the packet that they will be receiving so make sure to do both.  Reminded patient that all providers will be coming to the clinic to see them HERE and if they had any questions to not hesitate to reach back out to myself or their navigators.

## 2023-09-27 ENCOUNTER — Other Ambulatory Visit: Payer: Managed Care, Other (non HMO)

## 2023-09-27 LAB — SURGICAL PATHOLOGY

## 2023-10-01 ENCOUNTER — Encounter: Payer: Self-pay | Admitting: *Deleted

## 2023-10-01 DIAGNOSIS — Z170 Estrogen receptor positive status [ER+]: Secondary | ICD-10-CM | POA: Insufficient documentation

## 2023-10-02 NOTE — Progress Notes (Signed)
Radiation Oncology         [phone] ________________________________  Initial Outpatient Consultation  Name: Colleen Golden MRN: [id number]  Date: 10/03/2023  DOB: 1965/09/18  NW:GNFA, Onalee Hua, MD  Almond Lint, MD   REFERRING PHYSICIAN: Almond Lint, MD  DIAGNOSIS: No diagnosis found.   Cancer Staging  No matching staging information was found for the patient.  Stage *** Right Breast UIQ, Invasive lobular carcinoma with focal LCIS, ER+ / PR+ / Her2-, Grade 2  CHIEF COMPLAINT: Here to discuss management of right breast cancer  HISTORY OF PRESENT ILLNESS::Colleen Golden is a 58 y.o. female who is being seen today to discuss the role of radiation therapy in management of her recently diagnosed right breast cancer. She has dense breast tissue and a strong family history of breast cancer in her mother who was diagnosed at the age of 54, and in her maternal grandmother who was diagnosed at the age of 44. In this setting, she has presented for high risk screening breast MRI's since at least 2014.   Her most recent high-risk screening bilateral breast MRI on 09/18/23 demonstrated a new 6 mm mass in the far posterior upper inner right breast, a new 8 mm lower outer right breast mass, and a new 6 mm area of linear non-mass enhancement in the anterior upper left breast. No abnormal appearing lymph nodes were demonstrated in either axilla. No symptoms, if any, were reported at that time.    A right breast ultrasound was subsequently performed on 09/21/23 which re-demonstrated the suspicious appearing 6 m mass in the upper inner (2 o'clock) right breast, located 5 cmfn. The previously demonstrated lower outer right breast mass was not appreciated sonographically.   Biopsy of the 2 o'clock right breast mass on date of 09/21/23 showed grade 2 invasive lobular carcinoma measuring 3 mm in the greatest linear extent of the sample, with focal LCIS.  ER status: 50% positive with weak staining  intensity; PR status 100% positive with strong staining intensity; Proliferation marker Ki67 at 5%; Her2 status negative; Grade 2. No lymph nodes were examined.   Biopsies of the lower outer right breast and anterior upper left breast abnormalities were also collected on 09/26/23. Both biopsies came back negative for malignancy, and respectively showed findings consistent with a fibroepithelial lesion favoring a fibroadenomatoid nodule and pseudoangiomatous stromal hyperplasia in the lower outer right breast, and another fibroepithelial lesion favoring fibroadenoma in the upper left breast.   As it pertains to her recent diagnosis, her prior bilateral breast MRI performed in June of 2021 demonstrated a likely benign mass in the lower outer right breast measuring 5 mm. A right breast ultrasound was subsequently performed on 03/01/2020 to further evaluate this finding which showed no sonographic correlate to account for the lower outer right breast demonstrated on her MRI. She however underwent a biopsy of the lower outer right breast on 03/19/20 based on her high risk history.  Pathology showed no evidence of malignancy and findings consistent with UDH, duct ectasia, and pseudoangiomatous stromal hyperplasia.   ***  PREVIOUS RADIATION THERAPY: No  PAST MEDICAL HISTORY:  has a past medical history of Arthritis and Thyroid disease.    PAST SURGICAL HISTORY: Past Surgical History:  Procedure Laterality Date   BREAST BIOPSY Right 09/21/2023   [place] RT BREAST BX W LOC DEV 1ST LESION IMG BX SPEC US GUIDE 09/21/2023 [facility] MAMMOGRAP[facility]   DILATION AND CURETTAGE OF UTERUS     THYROIDECTOMY N/A 05/29/2013   Procedure:  TOTAL THYROIDECTOMY;  Surgeon: Velora Heckler, MD;  Location: [facility];  Service: General;  Laterality: N/A;   TONSILLECTOMY     TOTAL HIP ARTHROPLASTY Right 2010    FAMILY HISTORY: family history includes Breast cancer in her mother; Prostate cancer in her father.  SOCIAL HISTORY:  reports that  she has never smoked. She has never used smokeless tobacco. She reports current alcohol use. She reports that she does not use drugs.  ALLERGIES: Patient has no known allergies.  MEDICATIONS:  Current Outpatient Medications  Medication Sig Dispense Refill   amoxicillin (AMOXIL) 500 MG capsule TAKE 4 CAPSULES 1 HOUR BEFORE DENTAL APPT (Patient not taking: Reported on 12/07/2020)     ARMOUR THYROID 90 MG tablet TAKE 1 TABLET BY MOUTH EVERY MORNING BEFORE BREAKFAST ON AN EMPTY STOMACH     Ascorbic Acid (VITAMIN C PO) Take by mouth.     Magnesium 100 MG CAPS Take by mouth.     meloxicam (MOBIC) 15 MG tablet TAKE 1 TABLET BY MOUTH EVERY DAY (Patient not taking: Reported on 12/07/2020) 30 tablet 0   OVER THE COUNTER MEDICATION "DIM" vitamin supplement (Patient not taking: Reported on 12/07/2020)     Progesterone Micronized (PROGESTERONE PO) Take by mouth.     Semaglutide,0.25 or 0.5MG /DOS, (OZEMPIC, 0.25 OR 0.5 MG/DOSE,) 2 MG/1.5ML SOPN Ozempic     VITAMIN D PO Take by mouth.     No current facility-administered medications for this encounter.    REVIEW OF SYSTEMS: As above in HPI.   PHYSICAL EXAM:  vitals were not taken for this visit.   General: Alert and oriented, in no acute distress HEENT: Head is normocephalic. Extraocular movements are intact. Oropharynx is clear. Neck: Neck is supple, no palpable cervical or supraclavicular lymphadenopathy. Heart: Regular in rate and rhythm with no murmurs, rubs, or gallops. Chest: Clear to auscultation bilaterally, with no rhonchi, wheezes, or rales. Abdomen: Soft, nontender, nondistended, with no rigidity or guarding. Extremities: No cyanosis or edema. Lymphatics: see Neck Exam Skin: No concerning lesions. Musculoskeletal: symmetric strength and muscle tone throughout. Neurologic: Cranial nerves II through XII are grossly intact. No obvious focalities. Speech is fluent. Coordination is intact. Psychiatric: Judgment and insight are intact. Affect is  appropriate. Breasts: *** . No other palpable masses appreciated in the breasts or axillae *** .    ECOG = ***  0 - Asymptomatic (Fully active, able to carry on all predisease activities without restriction)  1 - Symptomatic but completely ambulatory (Restricted in physically strenuous activity but ambulatory and able to carry out work of a light or sedentary nature. For example, light housework, office work)  2 - Symptomatic, <50% in bed during the day (Ambulatory and capable of all self care but unable to carry out any work activities. Up and about more than 50% of waking hours)  3 - Symptomatic, >50% in bed, but not bedbound (Capable of only limited self-care, confined to bed or chair 50% or more of waking hours)  4 - Bedbound (Completely disabled. Cannot carry on any self-care. Totally confined to bed or chair)  5 - Death   Santiago Glad MM, Creech RH, Tormey DC, et al. [phone]). "Toxicity and response criteria of the [facility]". Am. Evlyn Clines. Oncol. 5 (6): 649-55   LABORATORY DATA:  Lab Results  Component Value Date   WBC 7.3 05/22/2013   HGB 15.2 (H) 05/22/2013   HCT 44.6 05/22/2013   MCV 87.8 05/22/2013   PLT 275 05/22/2013   CMP  Component Value Date/Time   NA 136 05/30/2013 0527   K 4.2 05/30/2013 0527   CL 102 05/30/2013 0527   CO2 24 05/30/2013 0527   GLUCOSE 164 (H) 05/30/2013 0527   BUN 8 05/30/2013 0527   CREATININE 0.58 05/30/2013 0527   CALCIUM 8.6 05/30/2013 0527   PROT 7.1 04/27/2010 1250   ALBUMIN 4.5 04/27/2010 1250   AST 18 04/27/2010 1250   ALT 12 04/27/2010 1250   ALKPHOS 65 04/27/2010 1250   BILITOT 0.9 04/27/2010 1250   GFRNONAA >90 05/30/2013 0527   GFRAA >90 05/30/2013 0527         RADIOGRAPHY: MR LT BREAST BX W LOC DEV 1ST LESION IMAGE BX SPEC MR GUIDE Addendum Date: 09/28/2023 ADDENDUM REPORT: 09/28/2023 08:12 ADDENDUM: Pathology revealed FIBROEPITHELIAL LESION MOST CONSISTENT WITH FIBROADENOMATOID NODULE,  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 This was found to be concordant by Dr. Baird Lyons. Pathology revealed FIBROEPITHELIAL LESION MOST CONSISTENT WITH FIBROADENOMA of the LEFT breast, upper central, (cylindrical clip). This was found to be concordant by Dr. Baird Lyons. Pathology results were discussed with the patient by telephone. The patient reported doing well after the biopsies with tenderness at the sites. Post biopsy instructions and care were reviewed and questions were answered. The patient was encouraged to call [facility] for any additional concerns. My direct phone number was provided. The patient has a recent diagnosis of RIGHT breast cancer and was referred to [facility] on October 03, 2023. The patient was instructed to return for a bilateral breast MRI in 6 months, per protocol. NOTE: The patient should be scheduled for ultrasound guided hydromark clip to be placed in the anterior aspect of the mass in the RIGHT breast, 2 o'clock, (ribbon clip), as the clip is not visualized secondary to the far posterior location of the mass. If breast conservation is performed, localization with ultrasound guidance would be suggested. Pathology results reported by Rene Kocher, RN on 09/28/2023. Electronically Signed   By: Baird Lyons M.D.   On: 09/28/2023 08:12   Result Date: 09/28/2023 CLINICAL DATA:  58 year old female with a strong family history of breast cancer. Screening MRI demonstrated a mass in the far posterior upper inner quadrant of the right breast. The mass was identified with ultrasound and core biopsy was performed on 09/21/2023. Biopsy was positive for invasive lobular carcinoma. A ribbon shaped clip was placed into the mass. Mammographic images were obtained following the biopsy and the ribbon shaped clip was not visualized secondary to the far  posterior location of the mass. Additional mass was seen in the lower outer quadrant of the RIGHT breast which was not identified sonographically and linear non masslike enhancement was seen in the anterior upper LEFT breast. MRI guided core biopsies recommended. Patient has a prior history of an MRI guided core biopsy of the right breast on 03/22/2020 demonstrating usual ductal hyperplasia and fibrocystic change (barbell shaped clip). EXAM: MRI GUIDED CORE NEEDLE BIOPSY OF THE BILATERAL BREAST TECHNIQUE: Multiplanar, multisequence MR imaging of the BOTH breast was performed both before and after administration of intravenous contrast. CONTRAST:  5 cc of Vueway COMPARISON:  Previous exam(s). FINDINGS: I met with the patient, and we discussed the procedure of MRI guided biopsy, including risks, benefits, and alternatives. Specifically, we discussed the risks of infection, bleeding, tissue injury, clip migration, and inadequate sampling. Informed, written consent was given. The usual time  out protocol was performed immediately prior to the procedure. Using sterile technique, 1% lidocaine and 1% lidocaine with epinephrine, MRI guidance, and a 9 gauge vacuum assisted device, biopsy was performed of a mass in the outer quadrant of the right breast using a lateral to medial approach. At the conclusion of the procedure, a cylindrical tissue marker clip was deployed into the biopsy cavity. Follow-up 2-view mammogram was performed and dictated separately. Using sterile technique, 1% lidocaine and 1% lidocaine with epinephrine, MRI guidance, and a 9 gauge vacuum assisted device, biopsy was performed of non masslike enhancement in the upper central left breast using a lateral to medial approach. At the conclusion of the procedure, a cylindrical tissue marker clip was deployed into the biopsy cavity. Follow-up 2-view mammogram was performed and dictated separately. IMPRESSION: MRI guided biopsies of both breast.  No apparent  complications. Electronically Signed: By: Baird Lyons M.D. 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 This was found to be concordant by Dr. Baird Lyons. Pathology revealed FIBROEPITHELIAL LESION MOST CONSISTENT WITH FIBROADENOMA of the LEFT breast, upper central, (cylindrical clip). This was found to be concordant by Dr. Baird Lyons. Pathology results were discussed with the patient by telephone. The patient reported doing well after the biopsies with tenderness at the sites. Post biopsy instructions and care were reviewed and questions were answered. The patient was encouraged to call [facility] for any additional concerns. My direct phone number was provided. The patient has a recent diagnosis of RIGHT breast cancer and was referred to [facility] on October 03, 2023. The patient was instructed to return for a bilateral breast MRI in 6 months, per protocol. NOTE: The patient should be scheduled for ultrasound guided hydromark clip to be placed in the anterior aspect of the mass in the RIGHT breast, 2 o'clock, (ribbon clip), as the clip is not visualized secondary to the far posterior location of the mass. If breast conservation is performed, localization with ultrasound guidance would be suggested. Pathology results reported by Rene Kocher, RN on 09/28/2023. Electronically Signed   By: Baird Lyons M.D.   On: 09/28/2023 08:12   Result Date: 09/28/2023 CLINICAL DATA:  58 year old female with a strong family history of breast cancer. Screening MRI demonstrated a mass in the far posterior upper inner quadrant of the right breast.  The mass was identified with ultrasound and core biopsy was performed on 09/21/2023. Biopsy was positive for invasive lobular carcinoma. A ribbon shaped clip was placed into the mass. Mammographic images were obtained following the biopsy and the ribbon shaped clip was not visualized secondary to the far posterior location of the mass. Additional mass was seen in the lower outer quadrant of the RIGHT breast which was not identified sonographically and linear non masslike enhancement was seen in the anterior upper LEFT breast. MRI guided core biopsies recommended. Patient has a prior history of an MRI guided core biopsy of the right breast on 03/22/2020 demonstrating usual ductal hyperplasia and fibrocystic change (barbell shaped clip). EXAM: MRI GUIDED CORE NEEDLE BIOPSY OF THE BILATERAL BREAST TECHNIQUE: Multiplanar, multisequence MR imaging of the BOTH breast was performed both before and after administration of intravenous contrast. CONTRAST:  5  cc of Vueway COMPARISON:  Previous exam(s). FINDINGS: I met with the patient, and we discussed the procedure of MRI guided biopsy, including risks, benefits, and alternatives. Specifically, we discussed the risks of infection, bleeding, tissue injury, clip migration, and inadequate sampling. Informed, written consent was given. The usual time out protocol was performed immediately prior to the procedure. Using sterile technique, 1% lidocaine and 1% lidocaine with epinephrine, MRI guidance, and a 9 gauge vacuum assisted device, biopsy was performed of a mass in the outer quadrant of the right breast using a lateral to medial approach. At the conclusion of the procedure, a cylindrical tissue marker clip was deployed into the biopsy cavity. Follow-up 2-view mammogram was performed and dictated separately. Using sterile technique, 1% lidocaine and 1% lidocaine with epinephrine, MRI guidance, and a 9 gauge vacuum assisted device, biopsy was performed of non masslike  enhancement in the upper central left breast using a lateral to medial approach. At the conclusion of the procedure, a cylindrical tissue marker clip was deployed into the biopsy cavity. Follow-up 2-view mammogram was performed and dictated separately. IMPRESSION: MRI guided biopsies of both breast.  No apparent complications. Electronically Signed: By: Baird Lyons M.D. On: 09/26/2023 11:01   MM CLIP PLACEMENT RIGHT Result Date: 09/28/2023 CLINICAL DATA:  Status post bilateral MRI guided core biopsies. Patient has biopsy proven invasive lobular carcinoma in the 2 o'clock region of the right breast. EXAM: 3D DIAGNOSTIC BILATERAL MAMMOGRAM POST MRI BIOPSIES COMPARISON:  Previous exam(s). FINDINGS: 3D Mammographic images were obtained following MRI guided biopsies of both breast. The cylindrical shaped biopsy marker clip is in appropriate position in the lower outer quadrant of the right breast. The cylindrical shaped biopsy marker clip is in appropriate position in the upper left breast. IMPRESSION: Appropriate positioning of the cylindrical shaped biopsy marker clips in the lower outer quadrant of the right breast and upper left breast. Final Assessment: Post Procedure Mammograms for Marker Placement Electronically Signed   By: Baird Lyons M.D.   On: 09/28/2023 07:39   MM CLIP PLACEMENT LEFT Result Date: 09/28/2023 CLINICAL DATA:  Status post bilateral MRI guided core biopsies. Patient has biopsy proven invasive lobular carcinoma in the 2 o'clock region of the right breast. EXAM: 3D DIAGNOSTIC BILATERAL MAMMOGRAM POST MRI BIOPSIES COMPARISON:  Previous exam(s). FINDINGS: 3D Mammographic images were obtained following MRI guided biopsies of both breast. The cylindrical shaped biopsy marker clip is in appropriate position in the lower outer quadrant of the right breast. The cylindrical shaped biopsy marker clip is in appropriate position in the upper left breast. IMPRESSION: Appropriate positioning of the  cylindrical shaped biopsy marker clips in the lower outer quadrant of the right breast and upper left breast. Final Assessment: Post Procedure Mammograms for Marker Placement Electronically Signed   By: Baird Lyons M.D.   On: 09/28/2023 07:39   [place] RT BREAST BX W LOC DEV 1ST LESION IMG BX SPEC US GUIDE Addendum Date: 09/26/2023 ADDENDUM REPORT: 09/26/2023 11:03 ADDENDUM: Pathology revealed GRADE II INVASIVE LOBULAR CARCINOMA, FOCAL LOBULAR CARCINOMA IN SITU (LCIS) of the RIGHT breast, 2 o'clock, (ribbon clip). This was found to be concordant by Dr. Baird Lyons. Pathology results were discussed with the patient by telephone. The patient reported doing well after the biopsy with minimal tenderness at the site. Post biopsy instructions and care were reviewed and questions were answered. The patient was encouraged to call [facility] for any additional concerns. My direct phone number was provided. The patient  was referred to [facility] on October 03, 2023. The patient is scheduled for a BILATERAL breast MRI guided biopsies on September 26, 2023. Further recommendations will be guided by these results of this biopsies. Pathology results reported by Rene Kocher, RN on 09/26/2023. Electronically Signed   By: Baird Lyons M.D.   On: 09/26/2023 11:03   Result Date: 09/26/2023 CLINICAL DATA:  Right breast mass. EXAM: ULTRASOUND GUIDED RIGHT BREAST CORE NEEDLE BIOPSY COMPARISON:  Previous exam(s). PROCEDURE: I met with the patient and we discussed the procedure of ultrasound-guided biopsy, including benefits and alternatives. We discussed the high likelihood of a successful procedure. We discussed the risks of the procedure, including infection, bleeding, tissue injury, clip migration, and inadequate sampling. Informed written consent was given. The usual time-out protocol was performed immediately prior to the procedure.  Lesion quadrant: Upper inner quadrant Using sterile technique and 1% Lidocaine as local anesthetic, under direct ultrasound visualization, a 14 gauge spring-loaded device was used to perform biopsy of a mass in the upper-inner quadrant of the right breast using a inferior to superior approach. At the conclusion of the procedure ribbon shaped tissue marker clip was deployed into the biopsy cavity. Follow up 2 view mammogram was performed and dictated separately. IMPRESSION: Ultrasound guided biopsy of the right breast. No apparent complications. Electronically Signed: By: Baird Lyons M.D. On: 09/21/2023 14:31   MM CLIP PLACEMENT RIGHT Result Date: 09/21/2023 CLINICAL DATA:  Status post ultrasound-guided core biopsy of a mass in the 2 o'clock region of the right breast 5 cm from the nipple. EXAM: 3D DIAGNOSTIC RIGHT MAMMOGRAM POST ULTRASOUND BIOPSY COMPARISON:  Previous exam(s). FINDINGS: 3D Mammographic images were obtained following ultrasound guided biopsy of a mass in the upper-inner quadrant of the right breast. Multiple post biopsy images were obtained. The biopsy marking clip is not visualized secondary to the posterior location of the mass. IMPRESSION: Biopsy marker clip in the upper-inner quadrant of the right breast is not visualized secondary to the far posterior location of the mass. If the mass is malignant or excision is required localization with ultrasound guidance would be suggested. Final Assessment: Post Procedure Mammograms for Marker Placement Electronically Signed   By: Baird Lyons M.D.   On: 09/21/2023 15:09   [place] LIMITED ULTRASOUND INCLUDING AXILLA RIGHT BREAST Result Date: 09/21/2023 CLINICAL DATA:  58 year old female with a strong family history of breast cancer. Patient had a recent screening MRI on 09/18/2023 demonstrating a 6 mm mass in the upper-inner quadrant of the right breast, 8 mm mass in the lower outer quadrant of the right breast and linear area of enhancement in the anterior  upper left breast. Sonographic evaluation of the right breast with possible biopsies recommended. EXAM: ULTRASOUND OF THE RIGHT BREAST COMPARISON:  Previous exam(s). FINDINGS: On physical exam, I do not palpate a mass in the upper-inner quadrant of the right breast or lower outer quadrant of the right breast. Targeted ultrasound is performed, showing an irregular hypoechoic mass in the right breast at 2 o'clock 5 cm from the nipple measuring 6 x 5 x 6 mm. Sonographic evaluation of the lower outer quadrant of the right breast does not demonstrate a suspicious mass to correspond with the MR finding. IMPRESSION: Suspicious 6 mm mass in the upper-inner quadrant of the right breast. RECOMMENDATION: Ultrasound-guided core biopsy of the mass in the upper-inner quadrant of the right breast is recommended. MR guided core biopsies of the mass in  the lower outer quadrant of the right breast and linear area of enhancement in the anterior upper left breast recommended. I have discussed the findings and recommendations with the patient. If applicable, a reminder letter will be sent to the patient regarding the next appointment. BI-RADS CATEGORY  4: Suspicious. Electronically Signed   By: Baird Lyons M.D.   On: 09/21/2023 14:34   MR BREAST BILATERAL W WO CONTRAST INC CAD Result Date: 09/18/2023 CLINICAL DATA:  58 year old female presents for high lifetime risk of developing breast cancer. Strong family history. Patient with benign RIGHT breast biopsy performed in 2021. EXAM: BILATERAL BREAST MRI WITH AND WITHOUT CONTRAST TECHNIQUE: Multiplanar, multisequence MR images of both breasts were obtained prior to and following the intravenous administration of 5 ml of Vueway Three-dimensional MR images were rendered by post-processing of the original MR data on an independent workstation. The three-dimensional MR images were interpreted, and findings are reported in the following complete MRI report for this study. Three dimensional  images were evaluated at the independent interpreting workstation using the DynaCAD thin client. COMPARISON:  02/12/2020 and prior MRIs.  Prior mammograms FINDINGS: Breast composition: d. Extreme fibroglandular tissue. Background parenchymal enhancement: Mild Right breast: A new 0.6 cm enhancing mass within the far posterior UPPER INNER RIGHT breast is identified (image 6: Series 6). A new 0.8 cm enhancing mass within the LOWER OUTER RIGHT breast is identified, middle to posterior depth (105:6). No other suspicious abnormalities are noted within the RIGHT breast. Biopsy clip artifact within the LOWER RIGHT breast identified. Scattered cysts are present. Left breast: A new 0.6 cm area of linear non masslike enhancement within the anterior UPPER LEFT breast is noted (57:6). No other suspicious abnormalities within the LEFT breast noted. Lymph nodes: No abnormal appearing lymph nodes. Ancillary findings:  None. IMPRESSION: 1. New 0.6 cm far posterior UPPER INNER RIGHT breast mass and new 0.8 cm LOWER OUTER RIGHT breast mass. 2nd-look ultrasound with possible biopsies (if visualized sonographically) recommended. 2. New 0.6 cm linear non masslike enhancement within the anterior UPPER LEFT breast. MR guided biopsy recommended. 3. No abnormal appearing lymph nodes. RECOMMENDATION: RIGHT breast ultrasound with possible biopsies of MR detected RIGHT breast masses. If these masses are not visualized sonographically, MR guided RIGHT breast biopsies are recommended. MR guided biopsy of UPPER LEFT breast linear non masslike enhancement. BI-RADS CATEGORY  4: Suspicious. Electronically Signed   By: Harmon Pier M.D.   On: 09/18/2023 09:36      IMPRESSION/PLAN: ***   It was a pleasure meeting the patient today. We discussed the risks, benefits, and side effects of radiotherapy. I recommend radiotherapy to the *** to reduce her risk of locoregional recurrence by 2/3.  We discussed that radiation would take approximately ***  weeks to complete and that I would give the patient a few weeks to heal following surgery before starting treatment planning. *** If chemotherapy were to be given, this would precede radiotherapy. We spoke about acute effects including skin irritation and fatigue as well as much less common late effects including internal organ injury or irritation. We spoke about the latest technology that is used to minimize the risk of late effects for patients undergoing radiotherapy to the breast or chest wall. No guarantees of treatment were given. The patient is enthusiastic about proceeding with treatment. I look forward to participating in the patient's care.  I will await her referral back to me for postoperative follow-up and eventual CT simulation/treatment planning.  On date of service, in  total, I spent *** minutes on this encounter. Patient was seen in person.   __________________________________________   Lonie Peak, MD  This document serves as a record of services personally performed by Lonie Peak, MD. It was created on her behalf by Neena Rhymes, a trained medical scribe. The creation of this record is based on the scribe's personal observations and the provider's statements to them. This document has been checked and approved by the attending provider.

## 2023-10-03 ENCOUNTER — Encounter: Payer: Self-pay | Admitting: Radiation Oncology

## 2023-10-03 ENCOUNTER — Ambulatory Visit: Payer: Managed Care, Other (non HMO) | Attending: General Surgery | Admitting: Physical Therapy

## 2023-10-03 ENCOUNTER — Encounter: Payer: Self-pay | Admitting: Physical Therapy

## 2023-10-03 ENCOUNTER — Inpatient Hospital Stay: Payer: Managed Care, Other (non HMO) | Admitting: Licensed Clinical Social Worker

## 2023-10-03 ENCOUNTER — Encounter: Payer: Self-pay | Admitting: *Deleted

## 2023-10-03 ENCOUNTER — Inpatient Hospital Stay (HOSPITAL_BASED_OUTPATIENT_CLINIC_OR_DEPARTMENT_OTHER): Payer: Managed Care, Other (non HMO) | Admitting: Hematology and Oncology

## 2023-10-03 ENCOUNTER — Ambulatory Visit
Admission: RE | Admit: 2023-10-03 | Discharge: 2023-10-03 | Disposition: A | Discharge status: Home / Self Care | Payer: Managed Care, Other (non HMO) | Source: Ambulatory Visit | Attending: Radiation Oncology | Admitting: Radiation Oncology

## 2023-10-03 ENCOUNTER — Inpatient Hospital Stay: Payer: Managed Care, Other (non HMO) | Admitting: Genetic Counselor

## 2023-10-03 ENCOUNTER — Other Ambulatory Visit: Payer: Self-pay

## 2023-10-03 ENCOUNTER — Encounter: Payer: Self-pay | Admitting: Genetic Counselor

## 2023-10-03 ENCOUNTER — Inpatient Hospital Stay: Payer: Managed Care, Other (non HMO) | Attending: Hematology and Oncology

## 2023-10-03 VITALS — BP 131/75 | HR 64 | Temp 97.7°F | Resp 16 | Ht 64.96 in | Wt 107.0 lb

## 2023-10-03 DIAGNOSIS — R293 Abnormal posture: Secondary | ICD-10-CM | POA: Insufficient documentation

## 2023-10-03 DIAGNOSIS — Z170 Estrogen receptor positive status [ER+]: Secondary | ICD-10-CM

## 2023-10-03 DIAGNOSIS — C50211 Malignant neoplasm of upper-inner quadrant of right female breast: Secondary | ICD-10-CM

## 2023-10-03 DIAGNOSIS — Z1732 Human epidermal growth factor receptor 2 negative status: Secondary | ICD-10-CM

## 2023-10-03 DIAGNOSIS — Z1721 Progesterone receptor positive status: Secondary | ICD-10-CM

## 2023-10-03 DIAGNOSIS — Z8042 Family history of malignant neoplasm of prostate: Secondary | ICD-10-CM

## 2023-10-03 DIAGNOSIS — Z803 Family history of malignant neoplasm of breast: Secondary | ICD-10-CM

## 2023-10-03 LAB — CMP (CANCER CENTER ONLY)
ALT: 13 U/L (ref 0–44)
AST: 17 U/L (ref 15–41)
Albumin: 4.9 g/dL (ref 3.5–5.0)
Alkaline Phosphatase: 73 U/L (ref 38–126)
Anion gap: 7 (ref 5–15)
BUN: 13 mg/dL (ref 6–20)
CO2: 28 mmol/L (ref 22–32)
Calcium: 9.5 mg/dL (ref 8.9–10.3)
Chloride: 103 mmol/L (ref 98–111)
Creatinine: 0.66 mg/dL (ref 0.44–1.00)
GFR, Estimated: >60 mL/min (ref >60)
Glucose, Bld: 82 mg/dL (ref 70–99)
Potassium: 3.9 mmol/L (ref 3.5–5.1)
Sodium: 138 mmol/L (ref 135–145)
Total Bilirubin: 0.6 mg/dL (ref 0.0–1.2)
Total Protein: 7.5 g/dL (ref 6.5–8.1)

## 2023-10-03 LAB — CBC WITH DIFFERENTIAL (CANCER CENTER ONLY)
Abs Immature Granulocytes: 0.02 10*3/uL (ref 0.00–0.07)
Basophils Absolute: 0 10*3/uL (ref 0.0–0.1)
Basophils Relative: 1 %
Eosinophils Absolute: 0 10*3/uL (ref 0.0–0.5)
Eosinophils Relative: 0 %
HCT: 46.6 % — ABNORMAL HIGH (ref 36.0–46.0)
Hemoglobin: 15.8 g/dL — ABNORMAL HIGH (ref 12.0–15.0)
Immature Granulocytes: 0 %
Lymphocytes Relative: 17 %
Lymphs Abs: 1 10*3/uL (ref 0.7–4.0)
MCH: 30.1 pg (ref 26.0–34.0)
MCHC: 33.9 g/dL (ref 30.0–36.0)
MCV: 88.8 fL (ref 80.0–100.0)
Monocytes Absolute: 0.3 10*3/uL (ref 0.1–1.0)
Monocytes Relative: 5 %
Neutro Abs: 4.6 10*3/uL (ref 1.7–7.7)
Neutrophils Relative %: 77 %
Platelet Count: 311 10*3/uL (ref 150–400)
RBC: 5.25 MIL/uL — ABNORMAL HIGH (ref 3.87–5.11)
RDW: 12.6 % (ref 11.5–15.5)
WBC Count: 6 10*3/uL (ref 4.0–10.5)
nRBC: 0 % (ref 0.0–0.2)

## 2023-10-03 LAB — RESEARCH LABS

## 2023-10-03 LAB — GENETIC SCREENING ORDER

## 2023-10-03 NOTE — Therapy (Signed)
OUTPATIENT PHYSICAL THERAPY BREAST CANCER BASELINE EVALUATION   Patient Name: Colleen Golden MRN: [id number] DOB:[birth year], 58 y.o., female Today's Date: 10/03/2023  END OF SESSION:  PT End of Session - 10/03/23 1043     Visit Number 1    Number of Visits 2    Date for PT Re-Evaluation 11/28/23    PT Start Time 1105    PT Stop Time 1135    PT Time Calculation (min) 30 min    Activity Tolerance Patient tolerated treatment well    Behavior During Therapy [facility] for tasks assessed/performed             Past Medical History:  Diagnosis Date   Arthritis    Thyroid disease    Past Surgical History:  Procedure Laterality Date   BREAST BIOPSY Right 09/21/2023   [place] RT BREAST BX W LOC DEV 1ST LESION IMG BX SPEC US GUIDE 09/21/2023 [facility]   DILATION AND CURETTAGE OF UTERUS     THYROIDECTOMY N/A 05/29/2013   Procedure: TOTAL THYROIDECTOMY;  Surgeon: Velora Heckler, MD;  Location: [facility];  Service: General;  Laterality: N/A;   TONSILLECTOMY     TOTAL HIP ARTHROPLASTY Right 2010   Patient Active Problem List   Diagnosis Date Noted   Malignant neoplasm of upper-inner quadrant of right breast in female, estrogen receptor positive (HCC) 10/01/2023   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 Almond Lint  REFERRING DIAG: Right breast cancer  THERAPY DIAG:  Malignant neoplasm of upper-inner quadrant of right breast in female, estrogen receptor positive (HCC)  Abnormal posture  Rationale for Evaluation and Treatment: Rehabilitation  ONSET DATE: 09/21/2023  SUBJECTIVE:                                                                                                                                                                                           SUBJECTIVE STATEMENT: Patient reports she is here today to be seen by her medical team for her newly diagnosed right breast cancer.   PERTINENT HISTORY:   Patient was diagnosed on 09/21/2023 with right grade 2 invasive lobular carcinoma. It measures 6 mm and is located in the upper inner quadrant. It is ER/PR positive and HER2 negative with a Ki67 of 5%.   PATIENT GOALS:   reduce lymphedema risk and learn post op HEP.   PAIN:  Are you having pain? No  PRECAUTIONS: Active CA   RED FLAGS: None   HAND DOMINANCE: right  WEIGHT BEARING RESTRICTIONS: No  FALLS:  Has patient fallen in last 6 months? No  LIVING ENVIRONMENT: Patient  lives with: her husband and 53 y.o. daughter Lives in: House/apartment Has following equipment at home: None  OCCUPATION: Unemployed  LEISURE: She walks 4 miles per day (about an hour)  PRIOR LEVEL OF FUNCTION: Independent   OBJECTIVE: Note: Objective measures were completed at Evaluation unless otherwise noted.  COGNITION: Overall cognitive status: Within functional limits for tasks assessed    POSTURE:  Forward head and rounded shoulders posture  UPPER EXTREMITY AROM/PROM:  A/PROM RIGHT   eval   Shoulder extension 46  Shoulder flexion 165  Shoulder abduction 178  Shoulder internal rotation 59  Shoulder external rotation 87    (Blank rows = not tested)  A/PROM LEFT   eval  Shoulder extension 53  Shoulder flexion 155  Shoulder abduction 173  Shoulder internal rotation 73  Shoulder external rotation 90    (Blank rows = not tested)  CERVICAL AROM: All within normal limits  UPPER EXTREMITY STRENGTH: WFL  LYMPHEDEMA ASSESSMENTS (in cm):   LANDMARK RIGHT   eval  10 cm proximal to olecranon process 21.8  Olecranon process 20  10 cm proximal to ulnar styloid process 18  Just proximal to ulnar styloid process 12.7  Across hand at thumb web space 16.3  At base of 2nd digit 5.4  (Blank rows = not tested)  LANDMARK LEFT   eval  10 cm proximal to olecranon process 22.6  Olecranon process 20  10 cm proximal to ulnar styloid process 18.7  Just proximal to ulnar styloid process  13.3  Across hand at thumb web space 17.1  At base of 2nd digit 5.3  (Blank rows = not tested)  L-DEX LYMPHEDEMA SCREENING:  The patient was assessed using the L-Dex machine today to produce a lymphedema index baseline score. The patient will be reassessed on a regular basis (typically every 3 months) to obtain new L-Dex scores. If the score is > 6.5 points away from his/her baseline score indicating onset of subclinical lymphedema, it will be recommended to wear a compression garment for 4 weeks, 12 hours per day and then be reassessed. If the score continues to be > 6.5 points from baseline at reassessment, we will initiate lymphedema treatment. Assessing in this manner has a 95% rate of preventing clinically significant lymphedema.   L-DEX FLOWSHEETS - 10/03/23 1000       L-DEX LYMPHEDEMA SCREENING   Measurement Type Unilateral    L-DEX MEASUREMENT EXTREMITY Upper Extremity    POSITION  Standing    DOMINANT SIDE Right    At Risk Side Right    BASELINE SCORE (UNILATERAL) -5.9             QUICK DASH SURVEY:  Colleen Golden - 10/03/23 0001     Open a tight or new jar No difficulty    Do heavy household chores (wash walls, wash floors) No difficulty    Carry a shopping bag or briefcase No difficulty    Wash your back No difficulty    Use a knife to cut food No difficulty    Recreational activities in which you take some force or impact through your arm, shoulder, or hand (golf, hammering, tennis) No difficulty    During the past week, to what extent has your arm, shoulder or hand problem interfered with your normal social activities with family, friends, neighbors, or groups? Not at all    During the past week, to what extent has your arm, shoulder or hand problem limited your work or other regular daily activities Not  at all    Arm, shoulder, or hand pain. None    Tingling (pins and needles) in your arm, shoulder, or hand None    Difficulty Sleeping No difficulty    DASH Score 0 %               PATIENT EDUCATION:  Education details: Lymphedema risk reduction and post op shoulder/posture HEP Person educated: Patient Education method: Explanation, Demonstration, Handout Education comprehension: Patient verbalized understanding and returned demonstration  HOME EXERCISE PROGRAM: Patient was instructed today in a home exercise program today for post op shoulder range of motion. These included active assist shoulder flexion in sitting, scapular retraction, wall walking with shoulder abduction, and hands behind head external rotation.  She was encouraged to do these twice a day, holding 3 seconds and repeating 5 times when permitted by her physician.   ASSESSMENT:  CLINICAL IMPRESSION: Patient was diagnosed on 09/21/2023 with right grade 2 invasive lobular carcinoma. It measures 6 mm and is located in the upper inner quadrant. It is ER/PR positive and HER2 negative with a Ki67 of 5%. Her multidisciplinary medical team met prior to her assessments to determine a recommended treatment plan. She is planning to have a right lumpectomy and sentinel node biopsy followed by Oncotype testing, radiation, and anti-estrogen therapy. She will benefit from a post op PT reassessment to determine needs and from L-Dex screens every 3 months for 2 years to detect subclinical lymphedema.  Pt will benefit from skilled therapeutic intervention to improve on the following deficits: Decreased knowledge of precautions, impaired UE functional use, pain, decreased ROM, postural dysfunction.   PT treatment/interventions: ADL/self-care home management, pt/family education, therapeutic exercise  REHAB POTENTIAL: Excellent  CLINICAL DECISION MAKING: Stable/uncomplicated  EVALUATION COMPLEXITY: Low   GOALS: Goals reviewed with patient? YES  LONG TERM GOALS: (STG=LTG)    Name Target Date Goal status  1 Pt will be able to verbalize understanding of pertinent lymphedema risk reduction  practices relevant to her dx specifically related to skin care.  Baseline:  No knowledge 10/03/2023 Achieved at eval  2 Pt will be able to return demo and/or verbalize understanding of the post op HEP related to regaining shoulder ROM. Baseline:  No knowledge 10/03/2023 Achieved at eval  3 Pt will be able to verbalize understanding of the importance of attending the post op After Breast CA Class for further lymphedema risk reduction education and therapeutic exercise.  Baseline:  No knowledge 10/03/2023 Achieved at eval  4 Pt will demo she has regained full shoulder ROM and function post operatively compared to baselines.  Baseline: See objective measurements taken today. 11/28/2023     PLAN:  PT FREQUENCY/DURATION: EVAL and 1 follow up appointment.   PLAN FOR NEXT SESSION: will reassess 3-4 weeks post op to determine needs.   Patient will follow up at outpatient cancer rehab 3-4 weeks following surgery.  If the patient requires physical therapy at that time, a specific plan will be dictated and sent to the referring physician for approval. The patient was educated today on appropriate basic range of motion exercises to begin post operatively and the importance of attending the After Breast Cancer class following surgery.  Patient was educated today on lymphedema risk reduction practices as it pertains to recommendations that will benefit the patient immediately following surgery.  She verbalized good understanding.    Physical Therapy Information for After Breast Cancer Surgery/Treatment:  Lymphedema is a swelling condition that you may be at risk for in your arm  if you have lymph nodes removed from the armpit area.  After a sentinel node biopsy, the risk is approximately 5-9% and is higher after an axillary node dissection.  There is treatment available for this condition and it is not life-threatening.  Contact your physician or physical therapist with concerns. You may begin the 4  shoulder/posture exercises (see additional sheet) when permitted by your physician (typically a week after surgery).  If you have drains, you may need to wait until those are removed before beginning range of motion exercises.  A general recommendation is to not lift your arms above shoulder height until drains are removed.  These exercises should be done to your tolerance and gently.  This is not a "no pain/no gain" type of recovery so listen to your body and stretch into the range of motion that you can tolerate, stopping if you have pain.  If you are having immediate reconstruction, ask your plastic surgeon about doing exercises as he or she may want you to wait. We encourage you to attend the free one time ABC (After Breast Cancer) class offered by [facility].  You will learn information related to lymphedema risk, prevention and treatment and additional exercises to regain mobility following surgery.  You can call [phone] for more information.  This is offered the 1st and 3rd Monday of each month.  You only attend the class one time. While undergoing any medical procedure or treatment, try to avoid blood pressure being taken or needle sticks from occurring on the arm on the side of cancer.   This recommendation begins after surgery and continues for the rest of your life.  This may help reduce your risk of getting lymphedema (swelling in your arm). An excellent resource for those seeking information on lymphedema is the National Lymphedema Network's web site. It can be accessed at www.lymphnet.org If you notice swelling in your hand, arm or breast at any time following surgery (even if it is many years from now), please contact your doctor or physical therapist to discuss this.  Lymphedema can be treated at any time but it is easier for you if it is treated early on.  If you feel like your shoulder motion is not returning to normal in a reasonable amount of time, please contact  your surgeon or physical therapist.  [facility] [phone]. [address], [address], [place] [address]  ABC CLASS After Breast Cancer Class  After Breast Cancer Class is a specially designed exercise class to assist you in a safe recover after having breast cancer surgery.  In this class you will learn how to get back to full function whether your drains were just removed or if you had surgery a month ago.  This one-time class is held the 1st and 3rd Monday of every month from 11:00 a.m. until 12:00 noon virtually.  This class is FREE and space is limited. For more information or to register for the next available class, call [phone].  Class Goals  Understand specific stretches to improve the flexibility of you chest and shoulder. Learn ways to safely strengthen your upper body and improve your posture. Understand the warning signs of infection and why you may be at risk for an arm infection. Learn about Lymphedema and prevention.  ** You do not attend this class until after surgery.  Drains must be removed to participate  Patient was instructed today in a home exercise program today for  post op shoulder range of motion. These included active assist shoulder flexion in sitting, scapular retraction, wall walking with shoulder abduction, and hands behind head external rotation.  She was encouraged to do these twice a day, holding 3 seconds and repeating 5 times when permitted by her physician.  Bethann Punches, [place] 10/03/23 11:42 AM

## 2023-10-03 NOTE — Assessment & Plan Note (Addendum)
This is a 58 year old perimenopausal female patient on HRT for the past 12 to 13 years, significant family history of breast cancer in mom, 3 paternal aunts presented with a new diagnosis of right breast grade 2 ILC, ER weak staining, progesterone strong staining and HER2 negative.  Invasive Lobular Carcinoma Small mass in right breast, estrogen weakly positive, progesterone strongly positive. Discussed the unusual hormone receptor profile and the need to repeat testing after surgery. Patient has been on hormone replacement therapy for 12-13 years. -Plan for lumpectomy, with consideration for mastectomy based on patient preference and genetic testing results. -Order genetic testing to assess for high penetrance gene mutations that may influence surgical decision. -Consider antiestrogen therapy post-surgery and radiation, with discussion of potential side effects and challenges due to long-term hormone replacement therapy. -She must stop all forms of HRT.  Hormone Replacement Therapy Patient has been on estrogen and progesterone for 12-13 years. Recently stopped progesterone and experiencing withdrawal symptoms.  She is still has the estrogen pellets and is due for another estrogen supplementation at the end of February.  She originally was started on these for questionable panic attacks and severe hot flashes. -Discussed potential impact on breast cancer and the need to transition to antiestrogen therapy post-surgery and radiation. -Consider non-hormonal methods to manage withdrawal symptoms.  Family History of Breast Cancer Significant family history of breast cancer, including mother and three aunts on father's side. -Genetic testing ordered to assess risk.  Breast Discharge Patient reports crusty debris from breast. -Continue to monitor. -She has chronically inverted nipples.  General Health Maintenance / Followup Plans -Plan for surgery in approximately two weeks. -Post-surgery, consider  radiation based on type of surgery performed. -Post-radiation, consider antiestrogen therapy, with discussion of potential side effects and challenges due to long-term hormone replacement therapy. We have discussed in detail about the mechanical of action of tamoxifen versus aromatase inhibitors and adverse effects with each class.  Given prolonged use of HRT, she may be able to tolerate tamoxifen better.  Also her menopausal status is not clear hence we may elect doing menopausal labs in the future if she decides not to try tamoxifen. -Repeat hormone receptor testing post-surgery. -Follow-up after genetic testing results are available to discuss implications for treatment plan.

## 2023-10-03 NOTE — Research (Addendum)
Trial: [employer] 2021-05 - Specimen Collection Study to Evaluate Biomarkers in Subjects with Cancer    Patient Colleen Golden was identified by Dr. Al Pimple as a potential candidate for the above listed study.  This Clinical Research Nurse met with Colleen Golden, [id number], on 10/03/23 in a manner and location that ensures patient privacy to discuss participation in the above listed research study.  Patient is Unaccompanied.  A copy of the informed consent document with embedded HIPAA language was provided to the patient.  Patient reads, speaks, and understands [place].    Patient was provided with the business card of this Nurse and encouraged to contact the research team with any questions.  Patient was provided the option of taking informed consent documents home to review and was encouraged to review at their convenience with their support network, including other care providers. Patient is comfortable with making a decision regarding study participation today.  As outlined in the informed consent form, this Nurse and [name] discussed the purpose of the research study, the investigational nature of the study, study procedures and requirements for study participation, potential risks and benefits of study participation, as well as alternatives to participation. This study is not blinded. The patient understands participation is voluntary and they may withdraw from study participation at any time.  This study does not involve randomization.  This study does not involve an investigational drug or device. This study does not involve a placebo. Patient understands enrollment is pending full eligibility review.   Confidentiality and how the patient's information will be used as part of study participation were discussed.  Patient was informed there is reimbursement provided for their time and effort spent on trial participation.  The patient is encouraged to discuss research study  participation with their insurance provider to determine what costs they may incur as part of study participation, including research related injury.    All questions were answered to patient's satisfaction.  The informed consent with embedded HIPAA language was reviewed page by page.  The patient's mental and emotional status is appropriate to provide informed consent, and the patient verbalizes an understanding of study participation.  Patient has agreed to participate in the above listed research study and has voluntarily signed the informed consent version [IRB Approved 17 Sep 2020, revised 03 Oct 2021]  with embedded HIPAA language, on 10/03/23 at 12:25 PM.  The patient was provided with a copy of the signed informed consent form with embedded HIPAA language for their reference.  No study specific procedures were obtained prior to the signing of the informed consent document.  Approximately 20 minutes were spent with the patient reviewing the informed consent documents.  Patient was not requested to complete a Release of Information form.  Eligibility: Eligibility criteria reviewed with patient. This Nurse has reviewed this patient's inclusion and exclusion criteria and confirmed patient is eligible for study participation. Eligibility confirmed by treating investigator, who also agrees that patient should proceed with enrollment. Patient will continue with enrollment.  Data Collection: Patient was interviewed to collect the following information.  Medical History:  High Blood Pressure  No Coronary Artery Disease No Lupus    No Rheumatoid Arthritis  No Diabetes   No      Lynch Syndrome  No  Is the patient currently taking a magnesium supplement?   No  Does the patient have a personal history of cancer (greater than 5 years ago)?  No  Does the patient have a family history  of cancer in 1st or 2nd degree relatives? Yes If yes, Relationship(s) and Cancer type(s)? Mother- breast, father-  prostate, aunt(s)x 2 - breast , grandmother- breast.   Does the patient have history of alcohol consumption? Yes   If yes, current or former? current  Number of years? 37 Drinks per week? 2  Does the patient have history of cigarette, cigar, pipe, or chewing tobacco use?  No    Blood Collection: Research blood obtained by [facility] venipuncture. Patient was stuck twice for blood sample. Able to obtain 4 full tubes. Patient tolerated well.  Gift Card: $50 gift card given to patient for her participation in this study.    Patient was thanked for their participation in this study.     Domenica Reamer, BSN, RN, [employer] Research Nurse II [phone] 10/03/2023

## 2023-10-03 NOTE — Research (Signed)
[employer] 2021-05 - Specimen Collection Study to Evaluate Biomarkers in Subjects with Cancer   This Nurse has reviewed this patient's inclusion and exclusion criteria as a second review and confirms Colleen Golden is eligible for study participation.  Patient may continue with enrollment.  Zerita Boers BSN RN Clinical Research Nurse [facility] Direct Dial: [phone] 10/03/2023  12:27 PM

## 2023-10-03 NOTE — Progress Notes (Signed)
[facility] CONSULT NOTE  Patient Care Team: Candice Camp, MD as PCP - General (Obstetrics and Gynecology) Pershing Proud, RN as Oncology Nurse Navigator Donnelly Angelica, RN as Oncology Nurse Navigator Rachel Moulds, MD as Consulting Physician (Hematology and Oncology)  CHIEF COMPLAINTS/PURPOSE OF CONSULTATION:  Newly diagnosed breast cancer  HISTORY OF PRESENTING ILLNESS:  Colleen Golden 58 y.o. female is here because of recent diagnosis of right breast cancer  I reviewed her records extensively and collaborated the history with the patient.  SUMMARY OF ONCOLOGIC HISTORY: Oncology History  Malignant neoplasm of upper-inner quadrant of right breast in female, estrogen receptor positive (HCC)  09/18/2023 Breast MRI   New 0.6 cm far posterior UPPER INNER RIGHT breast mass and new 0.8 cm LOWER OUTER RIGHT breast mass. 2nd-look ultrasound with possible biopsies (if visualized sonographically) recommended. New 0.6 cm linear non masslike enhancement within the anterior UPPER LEFT breast. MR guided biopsy recommended. No abnormal appearing lymph nodes.     09/21/2023 Pathology Results   Biopsy from the right breast 2:00 showed grade 2 invasive lobular carcinoma, focal LCIS, estrogen 50% positive weak staining intensity, progesterone receptor 100% positive strong staining intensity, HER2 1+ Ki-67 of 5%   10/01/2023 Initial Diagnosis   Malignant neoplasm of upper-inner quadrant of right breast in female, estrogen receptor positive (HCC)   10/03/2023 Cancer Staging   Staging form: Breast, AJCC 8th Edition - Clinical stage from 10/03/2023: Stage IA (cT1b, cN0, cM0, G2, ER+, PR+, HER2-) - Signed by Rachel Moulds, MD on 10/03/2023 Stage prefix: Initial diagnosis Histologic grading system: 3 grade system Laterality: Right Staged by: Pathologist and managing physician Stage used in treatment planning: Yes National guidelines used in treatment planning: Yes Type of  national guideline used in treatment planning: NCCN    Discussed the use of AI scribe software for clinical note transcription with the patient, who gave verbal consent to proceed.  History of Present Illness    The patient is a 58 year old perimenopausal?  female who presents for evaluation and management of invasive lobular cancer.  She has a 6mm invasive lobular cancer in the upper inner quadrant of the right breast. Two other areas in the breast were biopsied and found to be benign. The cancer is not strongly estrogen positive, which is atypical for lobular cancer, but her progesterone receptor status is 100% strong. She has been on hormone replacement therapy for almost 12-13 yrs including progesterone and estrogen pellets, and recently had an IUD removed. She stopped progesterone use on September 21, 2023, following her initial diagnosis.  She has a significant family history of breast cancer, including her mother, grandmother, and three aunts on her father's side. She is concerned about the potential for the benign areas to become cancerous and the implications of her family history on her own health.  Since stopping progesterone, she has experienced symptoms such as sweating and mild anxiety. 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  She underwent an MRI-guided biopsy, which she found to be a lengthy and uncomfortable procedure. She has a history of tender, lumpy, and sore breasts, which improved with hormone replacement therapy. She also reports a history of nipple discharge, described as debris, which has been present for years.  Rest of the pertinent 10 point ROS reviewed and neg.  MEDICAL HISTORY:  Past  Medical History:  Diagnosis Date   Arthritis    Thyroid disease     SURGICAL HISTORY: Past Surgical  History:  Procedure Laterality Date   BREAST BIOPSY Right 09/21/2023   [place] RT BREAST BX W LOC DEV 1ST LESION IMG BX SPEC US GUIDE 09/21/2023 [facility] M[facility]MOGRAPHY   DILATION AND CURETTAGE OF UTERUS     THYROIDECTOMY N/A 05/29/2013   Procedure: TOTAL THYROIDECTOMY;  Surgeon: Velora Heckler, MD;  Location: [facility];  Service: General;  Laterality: N/A;   TONSILLECTOMY     TOTAL HIP ARTHROPLASTY Right 2010    SOCIAL HISTORY: Social History   Socioeconomic History   Marital status: Married    Spouse name: Not on file   Number of children: Not on file   Years of education: Not on file   Highest education level: Not on file  Occupational History   Not on file  Tobacco Use   Smoking status: Never   Smokeless tobacco: Never  Substance and Sexual Activity   Alcohol use: Yes    Comment: 2/3 a week   Drug use: No   Sexual activity: Not on file  Other Topics Concern   Not on file  Social History Narrative   Not on file   Social Drivers of Health   Financial Resource Strain: Not on file  Food Insecurity: Not on file  Transportation Needs: Not on file  Physical Activity: Not on file  Stress: Not on file  Social Connections: Not on file  Intimate Partner Violence: Not on file    FAMILY HISTORY: Family History  Problem Relation Age of Onset   Breast cancer Mother    Prostate cancer Father    Colon cancer Neg Hx    Colon polyps Neg Hx    Esophageal cancer Neg Hx    Stomach cancer Neg Hx     ALLERGIES:  has no known allergies.  MEDICATIONS:  Current Outpatient Medications  Medication Sig Dispense Refill   ARMOUR THYROID 90 MG tablet Take 100 mg by mouth daily.     amoxicillin (AMOXIL) 500 MG capsule TAKE 4 CAPSULES 1 HOUR BEFORE DENTAL APPT (Patient not taking: Reported on 10/03/2023)     Ascorbic Acid (VITAMIN C PO) Take by mouth.     Magnesium 100 MG CAPS Take by mouth.     meloxicam (MOBIC) 15 MG tablet TAKE 1 TABLET BY MOUTH EVERY DAY (Patient not taking: Reported on  12/07/2020) 30 tablet 0   OVER THE COUNTER MEDICATION "DIM" vitamin supplement (Patient not taking: Reported on 12/07/2020)     Progesterone Micronized (PROGESTERONE PO) Take by mouth.     Semaglutide,0.25 or 0.5MG /DOS, (OZEMPIC, 0.25 OR 0.5 MG/DOSE,) 2 MG/1.5ML SOPN Ozempic     VITAMIN D PO Take by mouth.     No current facility-administered medications for this visit.    REVIEW OF SYSTEMS:   Constitutional: Denies fevers, chills or abnormal night sweats Eyes: Denies blurriness of vision, double vision or watery eyes Ears, nose, mouth, throat, and face: Denies mucositis or sore throat Respiratory: Denies cough, dyspnea or wheezes Cardiovascular: Denies palpitation, chest discomfort or lower extremity swelling Gastrointestinal:  Denies nausea, heartburn or change in bowel habits Skin: Denies abnormal skin rashes Lymphatics: Denies new lymphadenopathy or easy bruising Neurological:Denies numbness, tingling or new weaknesses Behavioral/Psych: Mood is stable, no new changes  Breast: Denies any palpable lumps or discharge All other systems were reviewed with the patient and are negative.  PHYSICAL EXAMINATION: ECOG PERFORMANCE STATUS: 0 -  Asymptomatic  Vitals:   10/03/23 0848  BP: 131/75  Pulse: 64  Resp: 16  Temp: 97.7 F (36.5 C)  SpO2: 100%   Filed Weights   10/03/23 0848  Weight: 107 lb (48.5 kg)    GENERAL:alert, no distress and comfortable BREAST: Sp breast biopsy with post biopsy changes. No regional adenopathy. No LE edema.  LABORATORY DATA:  I have reviewed the data as listed Lab Results  Component Value Date   WBC 7.3 05/22/2013   HGB 15.2 (H) 05/22/2013   HCT 44.6 05/22/2013   MCV 87.8 05/22/2013   PLT 275 05/22/2013   Lab Results  Component Value Date   NA 136 05/30/2013   K 4.2 05/30/2013   CL 102 05/30/2013   CO2 24 05/30/2013    RADIOGRAPHIC STUDIES: I have personally reviewed the radiological reports and agreed with the findings in the  report.  ASSESSMENT AND PLAN:  Malignant neoplasm of upper-inner quadrant of right breast in female, estrogen receptor positive (HCC) This is a 58 year old perimenopausal female patient on HRT for the past 12 to 13 years, significant family history of breast cancer in mom, 3 paternal aunts presented with a new diagnosis of right breast grade 2 ILC, ER weak staining, progesterone strong staining and HER2 negative.  Invasive Lobular Carcinoma Small mass in right breast, estrogen weakly positive, progesterone strongly positive. Discussed the unusual hormone receptor profile and the need to repeat testing after surgery. Patient has been on hormone replacement therapy for 12-13 years. -Plan for lumpectomy, with consideration for mastectomy based on patient preference and genetic testing results. -Order genetic testing to assess for high penetrance gene mutations that may influence surgical decision. -Consider antiestrogen therapy post-surgery and radiation, with discussion of potential side effects and challenges due to long-term hormone replacement therapy. -She must stop all forms of HRT.  Hormone Replacement Therapy Patient has been on estrogen and progesterone for 12-13 years. Recently stopped progesterone and experiencing withdrawal symptoms.  She is still has the estrogen pellets and is due for another estrogen supplementation at the end of February.  She originally was started on these for questionable panic attacks and severe hot flashes. -Discussed potential impact on breast cancer and the need to transition to antiestrogen therapy post-surgery and radiation. -Consider non-hormonal methods to manage withdrawal symptoms.  Family History of Breast Cancer Significant family history of breast cancer, including mother and three aunts on father's side. -Genetic testing ordered to assess risk.  Breast Discharge Patient reports crusty debris from breast. -Continue to monitor. -She has  chronically inverted nipples.  General Health Maintenance / Followup Plans -Plan for surgery in approximately two weeks. -Post-surgery, consider radiation based on type of surgery performed. -Post-radiation, consider antiestrogen therapy, with discussion of potential side effects and challenges due to long-term hormone replacement therapy. We have discussed in detail about the mechanical of action of tamoxifen versus aromatase inhibitors and adverse effects with each class.  Given prolonged use of HRT, she may be able to tolerate tamoxifen better.  Also her menopausal status is not clear hence we may elect doing menopausal labs in the future if she decides not to try tamoxifen. -Repeat hormone receptor testing post-surgery. -Follow-up after genetic testing results are available to discuss implications for treatment plan.  Time spent: 60 minutes including history, review of records, discussion in tumor board, over 45 minutes of face-to-face time and documentation All questions were answered. The patient knows to call the clinic with any problems, questions or concerns.  Rachel Moulds, MD 10/03/23

## 2023-10-03 NOTE — Progress Notes (Signed)
[facility] Clinical Social Work  Initial Assessment   Colleen Golden is a 58 y.o. year old female presenting alone (husband was present during doctor's visits). Clinical Social Work was referred by  [facility]  for assessment of psychosocial needs.   SDOH (Social Determinants of Health) assessments performed: Yes SDOH Interventions    Flowsheet Row Clinical Support from 10/03/2023 in [facility] - A Dept Of [place]  SDOH Interventions   Food Insecurity Interventions Intervention Not Indicated  Housing Interventions Intervention Not Indicated  Transportation Interventions Intervention Not Indicated  Utilities Interventions Intervention Not Indicated       SDOH Screenings   Food Insecurity: No Food Insecurity (10/03/2023)  Housing: Low Risk  (10/03/2023)  Transportation Needs: No Transportation Needs (10/03/2023)  Utilities: Not At Risk (10/03/2023)  Depression (PHQ2-9): Low Risk  (10/03/2023)  Tobacco Use: Low Risk  (10/03/2023)     Distress Screen completed: No     No data to display            Family/Social Information:  Housing Arrangement: patient lives with husband. 26yo daughter lives at home as she works & goes to school. 58yo lives in [place], [place] is in school in [place] Family members/support persons in your life? 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  Income source: retirement Geographical information systems officer concerns: No Type of concern: None Food access concerns: no Religious or spiritual practice: [name] is important to pt Advanced directives: Yes-has documents Services Currently in place:  [employer]  Coping/ Adjustment to diagnosis: Patient understands treatment plan and what happens next? yes, understands her potential treatment options and feels comfortable considering those and making decisions going forward Concerns about diagnosis and/or treatment: I'm not  especially worried about anything Patient enjoys time with family/ friends Current coping skills/ strengths: Ability for insight , Capable of independent living , Communication skills , and Supportive family/friends     SUMMARY: Current SDOH Barriers:  No major barriers identified at this time  Clinical Social Work Clinical Goal(s):  No clinical social work goals at this time  Interventions: Discussed common feeling and emotions when being diagnosed with cancer, and the importance of support during treatment Informed patient of the support team roles and support services at [facility] Provided CSW contact information and encouraged patient to call with any questions or concerns   Follow Up Plan: Patient will contact CSW with any support or resource needs Patient verbalizes understanding of plan: Yes    Colleen Golden Colleen Zafirah Vanzee, LCSW Clinical Social Worker [facility]

## 2023-10-03 NOTE — Progress Notes (Signed)
REFERRING PROVIDER: Rachel Moulds, MD [address] [place],  [place] [address]  PRIMARY PROVIDER:  Candice Camp, MD  PRIMARY REASON FOR VISIT:  1. Malignant neoplasm of upper-inner quadrant of right breast in female, estrogen receptor positive (HCC)   2. Family history of breast cancer   3. Family history of prostate cancer     HISTORY OF PRESENT ILLNESS:   [name], a 58 y.o. female, was seen for a [place] cancer genetics consultation at the request of Dr. Al Pimple due to a personal and family history of breast cancer.  Colleen Golden presents to clinic today to discuss the possibility of a hereditary predisposition to cancer, to discuss genetic testing, and to further clarify her future cancer risks, as well as potential cancer risks for family members.   In May 2019, [name] had hereditary cancer genetic testing [facility]) through her GYN office.  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  Myriad MyRisk includes sequencing and deletion/duplication testing of the following 35 genes: APC, ATM, AXIN2, BARD1, BMPR1A, BRCA1, BRCA2, BRIP1, CDH1, CDK4, CDKN2A, CHEK2, EPCAM (large rearrangement only), HOXB13, (sequencing only), GALNT12, MLH1, MSH2, MSH3 (excluding repetitive portions of exon 1), MSH6, MUTYH, NBN, NTHL1, PALB2, PMS2, PTEN, RAD51C, RAD51D, RNF43, RPS20, SMAD4, STK11, and TP53. Sequencing was performed for select regions of POLE and POLD1, and large rearrangement analysis was performed for select regions of GREM1.  After negative genetic testing, her lifetime risk for breast cancer was estimated to be ~25% (by Rockne Menghini and Myriad's risk score).  At that time, she was recommended to have high risk breast screening.   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 The treatment plan is pending.  She is still deciding whether she wishes to  proceed with breast conserving surgery or bilateral mastectomy.   CANCER HISTORY:  Oncology History  Malignant neoplasm of upper-inner quadrant of right breast in female, estrogen receptor positive (HCC)  09/18/2023 Breast MRI   New 0.6 cm far posterior UPPER INNER RIGHT breast mass and new 0.8 cm LOWER OUTER RIGHT breast mass. 2nd-look ultrasound with possible biopsies (if visualized sonographically) recommended. New 0.6 cm linear non masslike enhancement within the anterior UPPER LEFT breast. MR guided biopsy recommended. No abnormal appearing lymph nodes.     09/21/2023 Pathology Results   Biopsy from the right breast 2:00 showed grade 2 invasive lobular carcinoma, focal LCIS, estrogen 50% positive weak staining intensity, progesterone receptor 100% positive strong staining intensity, HER2 1+ Ki-67 of 5%   10/01/2023 Initial Diagnosis   Malignant neoplasm of upper-inner quadrant of right breast in female, estrogen receptor positive (HCC)   10/03/2023 Cancer Staging   Staging form: Breast, AJCC 8th Edition - Clinical stage from 10/03/2023: Stage IA (cT1b, cN0, cM0, G2, ER+, PR+, HER2-) - Signed by Rachel Moulds, MD on 10/03/2023 Stage prefix: Initial diagnosis Histologic grading system: 3 grade system Laterality: Right Staged by: Pathologist and managing physician Stage used in treatment planning: Yes National guidelines used in treatment planning: Yes Type of national guideline used in treatment planning: NCCN     Past Medical History:  Diagnosis Date   Arthritis    Thyroid disease     Past Surgical History:  Procedure Laterality Date   BREAST BIOPSY Right 09/21/2023   [place] RT BREAST BX W LOC DEV 1ST LESION IMG BX SPEC US GUIDE 09/21/2023 [facility] MAMMOGRAPHY  DILATION AND CURETTAGE OF UTERUS     THYROIDECTOMY N/A 05/29/2013   Procedure: TOTAL THYROIDECTOMY;  Surgeon: Velora Heckler, MD;  Location: [facility];  Service: General;  Laterality: N/A;   TONSILLECTOMY     TOTAL HIP  ARTHROPLASTY Right 2010    FAMILY HISTORY:  We obtained a detailed, 4-generation family history.  Significant diagnoses are listed below: Family History  Problem Relation Age of Onset   Breast cancer Mother 43       lumpectomy, x2 re-excisions   Prostate cancer Father        dx late 25s; recurrence w/ mets in 70s   Breast cancer Paternal Aunt        dx >50; bil mast   Breast cancer Paternal Aunt        dx 40s; d. 50s   Breast cancer Maternal Grandmother        dx before age 6   Breast cancer Cousin        pat female cousin; dx <50    [name] is unaware of previous family history of genetic testing for hereditary cancer risks. There is no reported Ashkenazi Jewish ancestry. There is no known consanguinity.  GENETIC COUNSELING ASSESSMENT: [name] is a 58 y.o. female with a personal and family history which is somewhat suggestive of a predisposition to cancer given the presence of related cancers in multiple generations of the family, some of whom were diagnosed at a young age. We, therefore, discussed and recommended the following at today's visit.   DISCUSSION:   We discussed that 5 - 10% of cancer is hereditary.  Most cases of hereditary breast cancer are associated with mutations in BRCA1/2.  There are other genes that can be associated with hereditary breast or prostate cancer syndromes.  We discussed that testing is beneficial for several reasons including knowing how to follow individuals for their cancer risks, identifying whether potential treatment/surgery options would be beneficial, and understanding if other family members could be at risk for cancer and allowing them to undergo genetic testing.   We reviewed her negative genetic testing results.    Even though a pathogenic variant was not identified in her 2019 testing, possible explanations for the cancer in the family may include: There may be no hereditary risk for cancer in the family. The cancers in [name]  and/or her family may be sporadic/familial or due to other genetic and environmental factors.  Most cancer is not hereditary.  There may be a gene mutation in one of these genes that testing methods at that time could not detect but that chance is small. There could be another gene that has not yet been discovered, or that we have not yet tested, that is responsible for the cancer diagnoses in the family.  It is also possible there is a hereditary cause for the cancer in the family that Ms. Rieth did not inherit.  We also reviewed the variant of uncertain significance (VUS) in RNF43. At this time, it is unknown if this variant is associated with increased cancer risk or if this is a normal finding, but most variants such as this get reclassified to being inconsequential. It should not be used to make medical management decisions.  Of note, RNF43 is considered a preliminary evidence gene at other labs and does not have well established association with cancer at this time.   ADDITIONAL GENETIC TESTING:  At this time, Ms. Matsushita previously genetic testing included all appropriate breast cancer  and prostate cancer genes as well as genes associated with other cancers.  We reviewed that in very rare scenarios, RNA analysis can detect variants missed by DNA analysis alone.  Myriad MyRisk testing did not include concurrent DNA and RNA analysis.  Ms. Wandel expressed a desire to pursue as comprehensive of testing possible, as she stated results would impact her surgical decisions, and thus wishes to update genetic testing to include RNA analysis.   We reviewed the implications of a negative, positive and variant of uncertain significant result.  Ms. Gallery  was offered a common hereditary cancer panel (~40 genes) and an expanded pan-cancer panel (~70 genes). Ms. Frazzini was informed of the benefits and limitations of each panel, including that expanded pan-cancer panels contain genes that do not have clear  management guidelines at this point in time.  After considering the benefits and limitations of each gene panel, Ms. Quinonez  elected to have an expanded pan-cancer panel through [facility] after analysis of the BRCAPlus panel.   The BRCAPlus gene panel offered by [facility] and includes sequencing and rearrangement analysis for the following 13 genes: ATM, BARD1, BRCA1, BRCA2, CDH1, CHEK2, NF1, PALB2, PTEN, RAD51C, RAD51D, STK11 and TP53.    The CancerNext-Expanded gene panel offered by [facility] and includes sequencing, rearrangement, and RNA analysis for the following 76 genes: AIP, ALK, APC, ATM, AXIN2, BAP1, BARD1, BMPR1A, BRCA1, BRCA2, BRIP1, CDC73, CDH1, CDK4, CDKN1B, CDKN2A, CEBPA, CHEK2, CTNNA1, DDX41, DICER1, ETV6, FH, FLCN, GATA2, LZTR1, MAX, MBD4, MEN1, MET, MLH1, MSH2, MSH3, MSH6, MUTYH, NF1, NF2, NTHL1, PALB2, PHOX2B, PMS2, POT1, PRKAR1A, PTCH1, PTEN, RAD51C, RAD51D, RB1, RET, RUNX1, SDHA, SDHAF2, SDHB, SDHC, SDHD, SMAD4, SMARCA4, SMARCB1, SMARCE1, STK11, SUFU, TMEM127, TP53, TSC1, TSC2, VHL, and WT1 (sequencing and deletion/duplication); EGFR, HOXB13, KIT, MITF, PDGFRA, POLD1, and POLE (sequencing only); EPCAM and GREM1 (deletion/duplication only).   Of note, the BRCAPlus panel does not include RNA analysis.  Thus, the most comprehensive analysis of the breast cancer genes will not be complete until release of the CancerNext-Expanded +RNA Panel.   We discussed that given she is pursuing updated genetic testing, there may be an out of pocket cost for testing.  If her out of pocket cost for testing is over $100, the laboratory should contact her and discuss the self-pay prices and/or patient pay assistance programs.    PLAN: After considering the risks, benefits, and limitations, Ms. Crute provided informed consent to pursue genetic testing and the blood sample was sent to [facility] for analysis of the BRCAPlus and CancerNext-Expanded +RNAinsight Panel. Results of the  BRCAPlus panel should be available within approximately 7-10 days, and results of the CancerNext-Expanded +RNAinsight Panel should be available within 14-21 days.  Results will be disclosed by telephone to Ms. Storer, as will any additional recommendations warranted by these results. Ms. Willbanks will receive a summary of her genetic counseling visit and a copy of her results once available. This information will also be available in Epic.   Ms. Mahone questions were answered to her satisfaction today. Our contact information was provided should additional questions or concerns arise. Thank you for the referral and allowing [place] to share in the care of your patient.   Micharl Helmes M. Rennie Plowman, MS, [facility] Genetic Counselor [email or web address] (P) [phone]   50 minutes were spent on the date of the encounter in service to the patient including preparation, face-to-face consultation, documentation and care coordination.  The patient was seen alone.  Dr. Al Pimple was available to discuss this case  as needed.   _______________________________________________________________________ For Office Staff:  Number of people involved in session: 1 Was an Intern/ student involved with case: no

## 2023-10-08 ENCOUNTER — Other Ambulatory Visit: Payer: Self-pay | Admitting: General Surgery

## 2023-10-09 ENCOUNTER — Encounter: Payer: Self-pay | Admitting: *Deleted

## 2023-10-09 ENCOUNTER — Telehealth: Payer: Self-pay | Admitting: *Deleted

## 2023-10-09 NOTE — Telephone Encounter (Signed)
 Spoke to pt concerning BMDC from 10/03/23. Denies questions or concerns regarding dx or treatment care plan. Pt informed she has decided to move forward with mastectomy with reconstruction. Informed physician team. Encourage pt to call with needs. Received verbal understanding.

## 2023-10-12 ENCOUNTER — Telehealth: Payer: Self-pay | Admitting: Genetic Counselor

## 2023-10-12 ENCOUNTER — Ambulatory Visit: Payer: Self-pay | Admitting: Genetic Counselor

## 2023-10-12 ENCOUNTER — Encounter: Payer: Self-pay | Admitting: Genetic Counselor

## 2023-10-12 DIAGNOSIS — Z1379 Encounter for other screening for genetic and chromosomal anomalies: Secondary | ICD-10-CM

## 2023-10-12 DIAGNOSIS — Z8042 Family history of malignant neoplasm of prostate: Secondary | ICD-10-CM

## 2023-10-12 DIAGNOSIS — C50211 Malignant neoplasm of upper-inner quadrant of right female breast: Secondary | ICD-10-CM

## 2023-10-12 DIAGNOSIS — Z803 Family history of malignant neoplasm of breast: Secondary | ICD-10-CM

## 2023-10-12 NOTE — Telephone Encounter (Signed)
 Contacted patient in attempt to disclose results of genetic testing.  LVM with contact information requesting a call back.

## 2023-10-12 NOTE — Telephone Encounter (Signed)
Disclosed negative genetics and VUS in POLE.

## 2023-10-12 NOTE — Progress Notes (Signed)
 HPI:   Ms. Prezioso was previously seen in the [facility] due to a personal and family history of breast cancer and concerns regarding a hereditary predisposition to cancer.    Ms. Sleep recent genetic test results were disclosed to her by telephone. These results and recommendations are discussed in more detail below.  CANCER HISTORY:  Oncology History  Malignant neoplasm of upper-inner quadrant of right breast in female, estrogen receptor positive (HCC)  09/18/2023 Breast MRI   New 0.6 cm far posterior UPPER INNER RIGHT breast mass and new 0.8 cm LOWER OUTER RIGHT breast mass. 2nd-look ultrasound with possible biopsies (if visualized sonographically) recommended. New 0.6 cm linear non masslike enhancement within the anterior UPPER LEFT breast. MR guided biopsy recommended. No abnormal appearing lymph nodes.     09/21/2023 Pathology Results   Biopsy from the right breast 2:00 showed grade 2 invasive lobular carcinoma, focal LCIS, estrogen 50% positive weak staining intensity, progesterone receptor 100% positive strong staining intensity, HER2 1+ Ki-67 of 5%   10/01/2023 Initial Diagnosis   Malignant neoplasm of upper-inner quadrant of right breast in female, estrogen receptor positive (HCC)   10/03/2023 Cancer Staging   Staging form: Breast, AJCC 8th Edition - Clinical stage from 10/03/2023: Stage IA (cT1b, cN0, cM0, G2, ER+, PR+, HER2-) - Signed by Loretha Ash, MD on 10/03/2023 Stage prefix: Initial diagnosis Histologic grading system: 3 grade system Laterality: Right Staged by: Pathologist and managing physician Stage used in treatment planning: Yes National guidelines used in treatment planning: Yes Type of national guideline used in treatment planning: NCCN   10/11/2023 Genetic Testing   Negative [facility] CancerNext-Expanded +RNAinsight Panel.  VUS in POLE at c.6531+4C>T. Report date is 10/11/2023.   The CancerNext-Expanded gene panel offered by [facility] and includes sequencing, rearrangement, and RNA analysis for the following 76 genes: AIP, ALK, APC, ATM, AXIN2, BAP1, BARD1, BMPR1A, BRCA1, BRCA2, BRIP1, CDC73, CDH1, CDK4, CDKN1B, CDKN2A, CEBPA, CHEK2, CTNNA1, DDX41, DICER1, ETV6, FH, FLCN, GATA2, LZTR1, MAX, MBD4, MEN1, MET, MLH1, MSH2, MSH3, MSH6, MUTYH, NF1, NF2, NTHL1, PALB2, PHOX2B, PMS2, POT1, PRKAR1A, PTCH1, PTEN, RAD51C, RAD51D, RB1, RET, RUNX1, SDHA, SDHAF2, SDHB, SDHC, SDHD, SMAD4, SMARCA4, SMARCB1, SMARCE1, STK11, SUFU, TMEM127, TP53, TSC1, TSC2, VHL, and WT1 (sequencing and deletion/duplication); EGFR, HOXB13, KIT, MITF, PDGFRA, POLD1, and POLE (sequencing only); EPCAM and GREM1 (deletion/duplication only).      FAMILY HISTORY:  We obtained a detailed, 4-generation family history.  Significant diagnoses are listed below: Family History  Problem Relation Age of Onset   Breast cancer Mother 31       lumpectomy, x2 re-excisions   Prostate cancer Father        dx late 37s; recurrence w/ mets in 70s   Breast cancer Paternal Aunt        dx >50; bil mast   Breast cancer Paternal Aunt        dx 34s; d. 45s   Breast cancer Maternal Grandmother        dx before age 72   Breast cancer Cousin        pat female cousin; dx <50    Ms. Demond is unaware of previous family history of genetic testing for hereditary cancer risks. There is no reported Ashkenazi Jewish ancestry. There is no known consanguinity.   GENETIC TEST RESULTS:  The [facility] CancerNext-Expanded +RNAinsight Panel found no pathogenic mutations.   The CancerNext-Expanded gene panel offered by [facility] and includes sequencing, rearrangement, and RNA analysis for the following  76 genes: AIP, ALK, APC, ATM, AXIN2, BAP1, BARD1, BMPR1A, BRCA1, BRCA2, BRIP1, CDC73, CDH1, CDK4, CDKN1B, CDKN2A, CEBPA, CHEK2, CTNNA1, DDX41, DICER1, ETV6, FH, FLCN, GATA2, LZTR1, MAX, MBD4, MEN1, MET, MLH1, MSH2, MSH3, MSH6, MUTYH, NF1, NF2, NTHL1, PALB2, PHOX2B, PMS2, POT1, PRKAR1A, PTCH1,  PTEN, RAD51C, RAD51D, RB1, RET, RUNX1, SDHA, SDHAF2, SDHB, SDHC, SDHD, SMAD4, SMARCA4, SMARCB1, SMARCE1, STK11, SUFU, TMEM127, TP53, TSC1, TSC2, VHL, and WT1 (sequencing and deletion/duplication); EGFR, HOXB13, KIT, MITF, PDGFRA, POLD1, and POLE (sequencing only); EPCAM and GREM1 (deletion/duplication only).   The test report has been scanned into EPIC and is located under the Molecular Pathology section of the Results Review tab.  A portion of the result report is included below for reference. Genetic testing reported out on October 11, 2023.      Genetic testing identified a variant of uncertain significance (VUS) in the POLE gene called  c.6531+4C>T.  At this time, it is unknown if this variant is associated with an increased risk for cancer or if it is benign, but most uncertain variants are reclassified to benign. It should not be used to make medical management decisions. With time, we suspect the laboratory will determine the significance of this variant, if any. If the laboratory reclassifies this variant, we will attempt to contact Ms. Langland to discuss it further.   Even though a pathogenic variant was not identified, possible explanations for the cancer in the family may include: There may be no hereditary risk for cancer in the family. The cancers in Ms. Meldrum and/or her family may be sporadic/familial or due to other genetic and environmental factors.  Most cancer is not hereditary.  There may be a gene mutation in one of these genes that current testing methods cannot detect but that chance is small. There could be another gene that has not yet been discovered, or that we have not yet tested, that is responsible for the cancer diagnoses in the family.  It is also possible there is a hereditary cause for the cancer in the family that Ms. Dilley did not inherit.   Therefore, it is important to remain in touch with cancer genetics in the future so that we can continue to offer Ms. Branan  the most up to date genetic testing.    ADDITIONAL GENETIC TESTING:   Ms. Drumwright genetic testing was fairly extensive.  If there are additional relevant genes identified to increase cancer risk that can be analyzed in the future, we would be happy to discuss and coordinate this testing at that time.    CANCER SCREENING RECOMMENDATIONS:  Ms. Nissan test result is considered negative (normal).  This means that we have not identified a hereditary cause for her personal history of breast cancer at this time.   An individual's cancer risk and medical management are not determined by genetic test results alone. Overall cancer risk assessment incorporates additional factors, including personal medical history, family history, and any available genetic information that may result in a personalized plan for cancer prevention and surveillance. Therefore, it is recommended she continue to follow the cancer management and screening guidelines provided by her oncology and primary healthcare provider.  RECOMMENDATIONS FOR FAMILY MEMBERS:   Since she did not inherit a identifiable mutation in a cancer predisposition gene included on this panel, her children could not have inherited a known mutation from her in one of these genes. Individuals in this family might be at some increased risk of developing cancer, over the general population risk, due to  the family history of cancer.  Individuals in the family should notify their providers of the family history of cancer. We recommend women in this family have a yearly mammogram beginning at age 39, or 9 years younger than the earliest onset of cancer, an annual clinical breast exam, and perform monthly breast self-exams.  Risk models that take into account family history and hormonal history may be helpful in determining appropriate breast cancer screening options for family members. Female relatives should speak with their providers about prostate cancer screening.   Other members of the family may still carry a pathogenic variant in one of these genes that Ms. Wimes did not inherit. Based on the family history, we recommend her mother, her paternal cousin with a history of breast cancer, and her paternal aunt with a history of breast cancer, have genetic counseling and testing. Ms. Millspaugh can let us  know if we can be of any assistance in coordinating genetic counseling and/or testing for these family members.   We do not recommend familial testing for the POLE variant of uncertain significance (VUS).  FOLLOW-UP:  Cancer genetics is a rapidly advancing field and it is possible that new genetic tests will be appropriate for her and/or her family members in the future. We encourage Ms. Armas to remain in contact with cancer genetics, so we can update her personal and family histories and let her know of advances in cancer genetics that may benefit this family.   Our contact number was provided.  They are welcome to call us  at anytime with additional questions or concerns.   Charli Halle M. Nydia, MS, [facility] Genetic Counselor [email or web address] (P) [phone]

## 2023-10-16 ENCOUNTER — Telehealth: Payer: Self-pay | Admitting: Hematology and Oncology

## 2023-10-16 ENCOUNTER — Encounter: Payer: Self-pay | Admitting: *Deleted

## 2023-10-16 NOTE — Telephone Encounter (Signed)
Scheduled appointment per 2/11 scheduling message. Patient is aware of the changes made and is active on MyChart.

## 2023-10-29 NOTE — H&P (Signed)
 Subjective Patient ID: Colleen Golden is a 58 y.o. female.     HPI   Returns for follow up discussion breast reconstruction. Presented following high risk screening MRI demonstrating right upper inner breast mass 0.6 cm and a 0.8 cm mass right LOQ. Follow up US showed mass in the right breast at 2 o'clock 5 cmfn 6 x 5 x 6 mm. No sonographic correlate right LOQ mass. 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 Left breast biopsy benign.    Plan repeat prognostics on surgical specimen.   Patient has elected for bilateral mastectomies.   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 Mother MGM PA and cousin with breast ca.   Current B cup over right and B/C over left. Wt down 35-40 lb over last 5 years.    Lives with spouse and adult daughter. Another daughter in school in [place], one child in [place]. Retired from Social research officer, government.    Review of Systems  All other systems reviewed and are negative.     Objective Physical Exam  Cardiovascular: Normal rate, regular rhythm and normal heart sounds.    Pulmonary/Chest Effort normal and breath sounds normal.    Abd: paucity soft tissue for autologous reconstruction   Lymph: no palpable axillary adenopathy   Breasts: no palpable masses, bilateral inverted nipples Chest wall asymmetry present with right side depressed/left more prominent SN to nipple R 20 L 21 cm BW R 15 L 17 cm CW 13 cm Nipple to IMF R 7 L 8 cm     Assessment/Plan Malignant neoplasm of upper-inner quadrant of right breast in female, estrogen receptor positive    Plan bilateral NSM with tissue expander acellular dermis reconstruction.   Reviewed incisions, drains, OR length, hospital stay and post operative limitations. Discussed process of expansion and implant based risks including rupture, imaging surveillance for silicone  implants, infection requiring surgery or removal, contracture. She has paucity soft tissue donor sites for purely autologous reconstruction and if she desires this recommend consultation with microsurgeon. Discussed future surgery dependent on adjuvant treatments.   Reviewed SSM vs NSM, both will be asensate and not stimulate. Reviewed with both risks mastectomy flap necrosis requiring additional surgery. She is candidate for NSM if desired. Counseled the inverted nipple may remain same or may correct with surgery. Reviewed I do not offer nerve grafting to address sensation and reviewed locations that may offer this. We reviewed some of her asymmetry volume is in part due to her chest wall asymmetry and this may become more noticeable post mastectomies.   Discussed use of acellular dermis in reconstruction, cadaveric source, incorporation over several weeks, risk that if has seroma or infection can act as additional nidus for infection if not incorporated. Reviewed this is off label use of ADM.   Discussed prepectoral vs sub pectoral reconstruction. Discussed with patient and benefit of this is no animation deformity, may be less pain. Risk may be more visible rippling over upper poles, greater need of ADM. Reviewed pre pectoral would require larger amount acellular dermis, more drains. Discussed any type reconstruction also risks long term displacement implant and visible rippling. If prepectoral counseled I would recommend she be comfortable with silicone implants as more options that have less rippling. She agrees to prepectoral placement.   Additional risks including but not limited to bleeding, seroma, hematoma, damage to adjacent structures, infection, asymmetry,  damage to adjacent structures, need for additional procedures, unacceptable cosmetic result, blood clots in legs or lungs reviewed.    Drain teaching completed. Rx for oxycodone Robaxin and Bactrim given.  Glenna Fellows, MD [facility] Plastic  & Reconstructive Surgery  Office/ physician access line after hours [phone]

## 2023-11-06 ENCOUNTER — Other Ambulatory Visit: Payer: Self-pay

## 2023-11-06 ENCOUNTER — Encounter (HOSPITAL_BASED_OUTPATIENT_CLINIC_OR_DEPARTMENT_OTHER): Payer: Self-pay | Admitting: General Surgery

## 2023-11-08 ENCOUNTER — Telehealth: Payer: Self-pay

## 2023-11-08 NOTE — Telephone Encounter (Signed)
 Pt called with concern regarding use of Tamoxifen as she currently has a uterine polyp and thicken uterine lining. She is scheduled for her bilat mastectomy 11/13/23 and is concerned about what antiestrogen therapy she should take, as Dr Elon Spanner is recommending she does NOT take Tamoxifen. She will have sx for her uterine polyps in 2 months. This message is forwarded to MD Iruku for her to advise.  S/w Dr Arlester Marker office to request imaging/ bx results and OV note.

## 2023-11-08 NOTE — Telephone Encounter (Signed)
 Pt made aware and she is agreeable to this plan.

## 2023-11-12 NOTE — Anesthesia Preprocedure Evaluation (Signed)
 Anesthesia Evaluation  Patient identified by MRN, date of birth, ID band Patient awake    Reviewed: Allergy & Precautions, NPO status , Patient's Chart, lab work & pertinent test results  History of Anesthesia Complications Negative for: history of anesthetic complications  Airway Mallampati: I  TM Distance: >3 FB Neck ROM: Full    Dental no notable dental hx. (+) Implants, Dental Advisory Given, Teeth Intact   Pulmonary neg pulmonary ROS   Pulmonary exam normal breath sounds clear to auscultation       Cardiovascular (-) hypertension(-) angina (-) Past MI negative cardio ROS Normal cardiovascular exam Rhythm:Regular Rate:Normal     Neuro/Psych negative neurological ROS  negative psych ROS   GI/Hepatic negative GI ROS, Neg liver ROS,,,  Endo/Other  Hypothyroidism    Renal/GU      Musculoskeletal  (+) Arthritis ,    Abdominal   Peds  Hematology Lab Results      Component                Value               Date                      WBC                      6.0                 10/03/2023                HGB                      15.8 (H)            10/03/2023                HCT                      46.6 (H)            10/03/2023                MCV                      88.8                10/03/2023                PLT                      311                 10/03/2023              Anesthesia Other Findings   Reproductive/Obstetrics                             Anesthesia Physical Anesthesia Plan  ASA: 2  Anesthesia Plan: General and Regional   Post-op Pain Management: Regional block*, Minimal or no pain anticipated, Ketamine IV* and Tylenol PO (pre-op)*   Induction: Intravenous  PONV Risk Score and Plan: 4 or greater and Ondansetron, Midazolam, Treatment may vary due to age or medical condition, Propofol infusion and Aprepitant  Airway Management Planned: Oral ETT  Additional  Equipment: None  Intra-op Plan:   Post-operative Plan: Extubation in OR  Informed Consent: I have reviewed  the patients History and Physical, chart, labs and discussed the procedure including the risks, benefits and alternatives for the proposed anesthesia with the patient or authorized representative who has indicated his/her understanding and acceptance.     Dental advisory given  Plan Discussed with: CRNA and Surgeon  Anesthesia Plan Comments: (TIVA GA w R Pec block)       Anesthesia Quick Evaluation

## 2023-11-13 ENCOUNTER — Other Ambulatory Visit: Payer: Self-pay

## 2023-11-13 ENCOUNTER — Ambulatory Visit (HOSPITAL_BASED_OUTPATIENT_CLINIC_OR_DEPARTMENT_OTHER): Payer: Self-pay | Admitting: Anesthesiology

## 2023-11-13 ENCOUNTER — Ambulatory Visit (HOSPITAL_BASED_OUTPATIENT_CLINIC_OR_DEPARTMENT_OTHER)
Admission: RE | Admit: 2023-11-13 | Discharge: 2023-11-14 | Disposition: A | Discharge status: Home / Self Care | Payer: Managed Care, Other (non HMO) | Attending: Plastic Surgery | Admitting: Plastic Surgery

## 2023-11-13 ENCOUNTER — Encounter (HOSPITAL_BASED_OUTPATIENT_CLINIC_OR_DEPARTMENT_OTHER): Payer: Self-pay | Admitting: General Surgery

## 2023-11-13 ENCOUNTER — Encounter (HOSPITAL_BASED_OUTPATIENT_CLINIC_OR_DEPARTMENT_OTHER)
Admission: RE | Disposition: A | Discharge status: Home / Self Care | Payer: Self-pay | Source: Home / Self Care | Attending: Plastic Surgery

## 2023-11-13 DIAGNOSIS — Z1732 Human epidermal growth factor receptor 2 negative status: Secondary | ICD-10-CM | POA: Diagnosis not present

## 2023-11-13 DIAGNOSIS — Z170 Estrogen receptor positive status [ER+]: Secondary | ICD-10-CM

## 2023-11-13 DIAGNOSIS — Z01818 Encounter for other preprocedural examination: Secondary | ICD-10-CM

## 2023-11-13 DIAGNOSIS — C50211 Malignant neoplasm of upper-inner quadrant of right female breast: Secondary | ICD-10-CM | POA: Diagnosis not present

## 2023-11-13 DIAGNOSIS — Z803 Family history of malignant neoplasm of breast: Secondary | ICD-10-CM | POA: Insufficient documentation

## 2023-11-13 DIAGNOSIS — C50411 Malignant neoplasm of upper-outer quadrant of right female breast: Secondary | ICD-10-CM | POA: Diagnosis not present

## 2023-11-13 DIAGNOSIS — Z1721 Progesterone receptor positive status: Secondary | ICD-10-CM | POA: Insufficient documentation

## 2023-11-13 DIAGNOSIS — C50912 Malignant neoplasm of unspecified site of left female breast: Secondary | ICD-10-CM | POA: Diagnosis present

## 2023-11-13 HISTORY — PX: BREAST RECONSTRUCTION WITH PLACEMENT OF TISSUE EXPANDER AND ALLODERM: SHX6805

## 2023-11-13 HISTORY — PX: NIPPLE SPARING MASTECTOMY WITH SENTINEL LYMPH NODE BIOPSY: SHX6826

## 2023-11-13 HISTORY — PX: NIPPLE SPARING MASTECTOMY: SHX6537

## 2023-11-13 HISTORY — DX: Prediabetes: R73.03

## 2023-11-13 HISTORY — DX: Malignant (primary) neoplasm, unspecified: C80.1

## 2023-11-13 LAB — POCT PREGNANCY, URINE: Preg Test, Ur: NEGATIVE

## 2023-11-13 SURGERY — NIPPLE SPARING MASTECTOMY WITH SENTINEL LYMPH NODE BIOPSY
Anesthesia: Regional | Site: Breast | Laterality: Right

## 2023-11-13 MED ORDER — HYDROMORPHONE HCL 1 MG/ML IJ SOLN: 0.2500 mg | INTRAMUSCULAR | Status: DC | PRN

## 2023-11-13 MED ORDER — BUPIVACAINE LIPOSOME 1.3 % IJ SUSP
INTRAMUSCULAR | Status: DC | PRN
  Administered 2023-11-13: 10 mL

## 2023-11-13 MED ORDER — DEXAMETHASONE SODIUM PHOSPHATE 10 MG/ML IJ SOLN
INTRAMUSCULAR | Status: AC
  Filled 2023-11-13: qty 1

## 2023-11-13 MED ORDER — CEFAZOLIN SODIUM-DEXTROSE 2-4 GM/100ML-% IV SOLN
2.0000 g | INTRAVENOUS | Status: AC
  Administered 2023-11-13: 2 g via INTRAVENOUS

## 2023-11-13 MED ORDER — MIDAZOLAM HCL 2 MG/2ML IJ SOLN
INTRAMUSCULAR | Status: AC
  Filled 2023-11-13: qty 2

## 2023-11-13 MED ORDER — APREPITANT 40 MG PO CAPS
ORAL_CAPSULE | ORAL | Status: AC
  Filled 2023-11-13: qty 1

## 2023-11-13 MED ORDER — SODIUM CHLORIDE (PF) 0.9 % IJ SOLN
INTRAMUSCULAR | Status: AC
  Filled 2023-11-13: qty 10

## 2023-11-13 MED ORDER — OXYCODONE HCL 5 MG/5ML PO SOLN: 5.0000 mg | Freq: Once | ORAL | Status: DC | PRN

## 2023-11-13 MED ORDER — POVIDONE-IODINE 10 % EX SOLN
CUTANEOUS | Status: DC | PRN
Start: 2023-11-13 — End: 2023-11-13
  Administered 2023-11-13: 1 via TOPICAL

## 2023-11-13 MED ORDER — KETOROLAC TROMETHAMINE 30 MG/ML IJ SOLN: 15.0000 mg | Freq: Once | INTRAMUSCULAR | Status: AC | PRN

## 2023-11-13 MED ORDER — ONDANSETRON HCL 4 MG/2ML IJ SOLN
INTRAMUSCULAR | Status: DC | PRN
  Administered 2023-11-13: 4 mg via INTRAVENOUS

## 2023-11-13 MED ORDER — PROPOFOL 1000 MG/100ML IV EMUL
INTRAVENOUS | Status: AC
  Filled 2023-11-13: qty 100

## 2023-11-13 MED ORDER — APREPITANT 40 MG PO CAPS
40.0000 mg | ORAL_CAPSULE | Freq: Once | ORAL | Status: AC
  Administered 2023-11-13: 40 mg via ORAL

## 2023-11-13 MED ORDER — CHLORHEXIDINE GLUCONATE CLOTH 2 % EX PADS
6.0000 | MEDICATED_PAD | Freq: Once | CUTANEOUS | Status: DC
Start: 2023-11-13 — End: 2023-11-13

## 2023-11-13 MED ORDER — FENTANYL CITRATE (PF) 100 MCG/2ML IJ SOLN
INTRAMUSCULAR | Status: AC
  Filled 2023-11-13: qty 2

## 2023-11-13 MED ORDER — LEVOTHYROXINE SODIUM 100 MCG PO TABS
100.0000 ug | ORAL_TABLET | Freq: Every day | ORAL | Status: DC
  Filled 2023-11-13: qty 1

## 2023-11-13 MED ORDER — PROPOFOL 10 MG/ML IV BOLUS
INTRAVENOUS | Status: AC
  Filled 2023-11-13: qty 20

## 2023-11-13 MED ORDER — TRANEXAMIC ACID 1000 MG/10ML IV SOLN
Status: DC | PRN
  Administered 2023-11-13: 3000 mg via TOPICAL

## 2023-11-13 MED ORDER — KCL IN DEXTROSE-NACL 20-5-0.45 MEQ/L-%-% IV SOLN
INTRAVENOUS | Status: DC
Start: 2023-11-13 — End: 2023-11-14
  Filled 2023-11-13 (×2): qty 1000

## 2023-11-13 MED ORDER — CHLORHEXIDINE GLUCONATE CLOTH 2 % EX PADS: 6.0000 | MEDICATED_PAD | Freq: Once | CUTANEOUS | Status: DC

## 2023-11-13 MED ORDER — ONDANSETRON HCL 4 MG/2ML IJ SOLN
INTRAMUSCULAR | Status: AC
  Filled 2023-11-13: qty 4

## 2023-11-13 MED ORDER — DEXMEDETOMIDINE HCL IN NACL 80 MCG/20ML IV SOLN
INTRAVENOUS | Status: AC
  Filled 2023-11-13: qty 20

## 2023-11-13 MED ORDER — OXYCODONE HCL 5 MG PO TABS: 5.0000 mg | ORAL_TABLET | Freq: Once | ORAL | Status: DC | PRN

## 2023-11-13 MED ORDER — FENTANYL CITRATE (PF) 100 MCG/2ML IJ SOLN
INTRAMUSCULAR | Status: DC | PRN
  Administered 2023-11-13: 25 ug via INTRAVENOUS
  Administered 2023-11-13 (×3): 50 ug via INTRAVENOUS
  Administered 2023-11-13: 25 ug via INTRAVENOUS

## 2023-11-13 MED ORDER — ONDANSETRON 4 MG PO TBDP: 4.0000 mg | ORAL_TABLET | Freq: Four times a day (QID) | ORAL | Status: DC | PRN

## 2023-11-13 MED ORDER — CEFAZOLIN SODIUM-DEXTROSE 2-4 GM/100ML-% IV SOLN
INTRAVENOUS | Status: AC
  Filled 2023-11-13: qty 100

## 2023-11-13 MED ORDER — KETAMINE HCL 10 MG/ML IJ SOLN
INTRAMUSCULAR | Status: DC | PRN
  Administered 2023-11-13: 10 mg via INTRAVENOUS
  Administered 2023-11-13: 20 mg via INTRAVENOUS

## 2023-11-13 MED ORDER — KETAMINE HCL 50 MG/5ML IJ SOSY
PREFILLED_SYRINGE | INTRAMUSCULAR | Status: AC
  Filled 2023-11-13: qty 5

## 2023-11-13 MED ORDER — FENTANYL CITRATE (PF) 100 MCG/2ML IJ SOLN
INTRAMUSCULAR | Status: AC
Start: 2023-11-13 — End: ?
  Filled 2023-11-13: qty 2

## 2023-11-13 MED ORDER — TRANEXAMIC ACID 1000 MG/10ML IV SOLN
INTRAVENOUS | Status: AC
  Filled 2023-11-13: qty 30

## 2023-11-13 MED ORDER — HYDROMORPHONE HCL 1 MG/ML IJ SOLN: 0.5000 mg | INTRAMUSCULAR | Status: DC | PRN

## 2023-11-13 MED ORDER — DEXAMETHASONE SODIUM PHOSPHATE 10 MG/ML IJ SOLN
INTRAMUSCULAR | Status: DC | PRN
  Administered 2023-11-13: 10 mg via INTRAVENOUS

## 2023-11-13 MED ORDER — OXYCODONE HCL 5 MG PO TABS
5.0000 mg | ORAL_TABLET | ORAL | Status: DC | PRN
Start: 2023-11-13 — End: 2023-11-14

## 2023-11-13 MED ORDER — MAGTRACE LYMPHATIC TRACER
INTRAMUSCULAR | Status: DC | PRN
  Administered 2023-11-13: 2 mL via INTRAMUSCULAR

## 2023-11-13 MED ORDER — LIDOCAINE 2% (20 MG/ML) 5 ML SYRINGE
INTRAMUSCULAR | Status: DC | PRN
  Administered 2023-11-13: 100 mg via INTRAVENOUS

## 2023-11-13 MED ORDER — PROPOFOL 10 MG/ML IV BOLUS
INTRAVENOUS | Status: DC | PRN
  Administered 2023-11-13: 140 mg via INTRAVENOUS

## 2023-11-13 MED ORDER — SODIUM CHLORIDE 0.9 % IV SOLN
INTRAVENOUS | Status: AC
  Filled 2023-11-13: qty 10

## 2023-11-13 MED ORDER — LACTATED RINGERS IV SOLN: INTRAVENOUS | Status: DC

## 2023-11-13 MED ORDER — LIOTHYRONINE SODIUM 5 MCG PO TABS
5.0000 ug | ORAL_TABLET | Freq: Every day | ORAL | Status: DC
Start: 2023-11-14 — End: 2023-11-14

## 2023-11-13 MED ORDER — ACETAMINOPHEN 500 MG PO TABS
ORAL_TABLET | ORAL | Status: AC
  Filled 2023-11-13: qty 2

## 2023-11-13 MED ORDER — HYDROMORPHONE HCL 1 MG/ML IJ SOLN
INTRAMUSCULAR | Status: AC
  Filled 2023-11-13: qty 0.5

## 2023-11-13 MED ORDER — METHOCARBAMOL 500 MG PO TABS
500.0000 mg | ORAL_TABLET | Freq: Four times a day (QID) | ORAL | Status: DC | PRN
  Administered 2023-11-14: 500 mg via ORAL
  Filled 2023-11-13: qty 1

## 2023-11-13 MED ORDER — BUPIVACAINE HCL (PF) 0.5 % IJ SOLN
INTRAMUSCULAR | Status: DC | PRN
  Administered 2023-11-13: 10 mL

## 2023-11-13 MED ORDER — ROCURONIUM BROMIDE 10 MG/ML (PF) SYRINGE
PREFILLED_SYRINGE | INTRAVENOUS | Status: AC
Start: 2023-11-13 — End: ?
  Filled 2023-11-13: qty 10

## 2023-11-13 MED ORDER — PROPOFOL 500 MG/50ML IV EMUL
INTRAVENOUS | Status: DC | PRN
  Administered 2023-11-13: 200 ug/kg/min via INTRAVENOUS
  Administered 2023-11-13: 175 ug/kg/min via INTRAVENOUS
  Administered 2023-11-13: 200 ug/kg/min via INTRAVENOUS

## 2023-11-13 MED ORDER — 0.9 % SODIUM CHLORIDE (POUR BTL) OPTIME
TOPICAL | Status: DC | PRN
  Administered 2023-11-13: 1500 mL

## 2023-11-13 MED ORDER — LIDOCAINE 2% (20 MG/ML) 5 ML SYRINGE
INTRAMUSCULAR | Status: AC
  Filled 2023-11-13: qty 5

## 2023-11-13 MED ORDER — MIDAZOLAM HCL 2 MG/2ML IJ SOLN
1.0000 mg | Freq: Once | INTRAMUSCULAR | Status: AC
  Administered 2023-11-13: 1 mg via INTRAVENOUS

## 2023-11-13 MED ORDER — FENTANYL CITRATE (PF) 100 MCG/2ML IJ SOLN
50.0000 ug | Freq: Once | INTRAMUSCULAR | Status: AC
  Administered 2023-11-13: 50 ug via INTRAVENOUS

## 2023-11-13 MED ORDER — ROCURONIUM BROMIDE 100 MG/10ML IV SOLN
INTRAVENOUS | Status: DC | PRN
  Administered 2023-11-13: 50 mg via INTRAVENOUS
  Administered 2023-11-13: 10 mg via INTRAVENOUS

## 2023-11-13 MED ORDER — ACETAMINOPHEN 325 MG PO TABS: 650.0000 mg | ORAL_TABLET | Freq: Four times a day (QID) | ORAL | Status: DC | PRN

## 2023-11-13 MED ORDER — KETOROLAC TROMETHAMINE 30 MG/ML IJ SOLN
30.0000 mg | Freq: Three times a day (TID) | INTRAMUSCULAR | Status: AC
  Administered 2023-11-13 – 2023-11-14 (×3): 30 mg via INTRAVENOUS
  Filled 2023-11-13 (×3): qty 1

## 2023-11-13 MED ORDER — ENOXAPARIN SODIUM 40 MG/0.4ML IJ SOSY
40.0000 mg | PREFILLED_SYRINGE | INTRAMUSCULAR | Status: DC
  Administered 2023-11-14: 40 mg via SUBCUTANEOUS
  Filled 2023-11-13: qty 0.4

## 2023-11-13 MED ORDER — SODIUM CHLORIDE 0.9 % IV SOLN
INTRAVENOUS | Status: DC | PRN
  Administered 2023-11-13: 500 mL

## 2023-11-13 MED ORDER — BUPIVACAINE HCL (PF) 0.5 % IJ SOLN
INTRAMUSCULAR | Status: AC
  Filled 2023-11-13: qty 30

## 2023-11-13 MED ORDER — ACETAMINOPHEN 500 MG PO TABS
1000.0000 mg | ORAL_TABLET | ORAL | Status: AC
  Administered 2023-11-13: 1000 mg via ORAL

## 2023-11-13 MED ORDER — ONDANSETRON HCL 4 MG/2ML IJ SOLN: 4.0000 mg | Freq: Once | INTRAMUSCULAR | Status: DC | PRN

## 2023-11-13 MED ORDER — BUPIVACAINE HCL (PF) 0.25 % IJ SOLN
INTRAMUSCULAR | Status: AC
  Filled 2023-11-13: qty 30

## 2023-11-13 MED ORDER — CEFAZOLIN SODIUM-DEXTROSE 1-4 GM/50ML-% IV SOLN
1.0000 g | Freq: Three times a day (TID) | INTRAVENOUS | Status: DC
Start: 2023-11-13 — End: 2023-11-14
  Administered 2023-11-13 – 2023-11-14 (×2): 1 g via INTRAVENOUS
  Filled 2023-11-13 (×2): qty 50

## 2023-11-13 MED ORDER — PROPOFOL 1000 MG/100ML IV EMUL
INTRAVENOUS | Status: AC
  Filled 2023-11-13: qty 200

## 2023-11-13 MED ORDER — SUGAMMADEX SODIUM 200 MG/2ML IV SOLN
INTRAVENOUS | Status: DC | PRN
  Administered 2023-11-13: 100 mg via INTRAVENOUS

## 2023-11-13 MED ORDER — HYDROMORPHONE HCL 1 MG/ML IJ SOLN
INTRAMUSCULAR | Status: DC | PRN
  Administered 2023-11-13: .25 mg via INTRAVENOUS

## 2023-11-13 MED ORDER — BUPIVACAINE HCL (PF) 0.5 % IJ SOLN
INTRAMUSCULAR | Status: DC | PRN
Start: 2023-11-13 — End: 2023-11-13
  Administered 2023-11-13: 20 mL

## 2023-11-13 MED ORDER — ONDANSETRON HCL 4 MG/2ML IJ SOLN
4.0000 mg | Freq: Four times a day (QID) | INTRAMUSCULAR | Status: DC | PRN
Start: 2023-11-13 — End: 2023-11-14

## 2023-11-13 SURGICAL SUPPLY — 114 items
ALLOGRAFT PERF 16X20 1.6+/-0.4 (Tissue) IMPLANT
APPLIER CLIP 11 MED OPEN (CLIP) IMPLANT
APPLIER CLIP 9.375 MED OPEN (MISCELLANEOUS) IMPLANT
BAG DECANTER FOR FLEXI CONT (MISCELLANEOUS) ×3 IMPLANT
BENZOIN TINCTURE PRP APPL 2/3 (GAUZE/BANDAGES/DRESSINGS) IMPLANT
BINDER BREAST LRG (GAUZE/BANDAGES/DRESSINGS) IMPLANT
BINDER BREAST MEDIUM (GAUZE/BANDAGES/DRESSINGS) IMPLANT
BINDER BREAST XLRG (GAUZE/BANDAGES/DRESSINGS) IMPLANT
BINDER BREAST XXLRG (GAUZE/BANDAGES/DRESSINGS) IMPLANT
BIOPATCH RED 1 DISK 7.0 (GAUZE/BANDAGES/DRESSINGS) IMPLANT
BLADE CLIPPER SURG (BLADE) IMPLANT
BLADE HEX COATED 2.75 (ELECTRODE) ×3 IMPLANT
BLADE SURG 10 STRL SS (BLADE) ×3 IMPLANT
BLADE SURG 15 STRL LF DISP TIS (BLADE) ×3 IMPLANT
BNDG ESMARK 4X9 LF (GAUZE/BANDAGES/DRESSINGS) ×3 IMPLANT
BNDG GAUZE DERMACEA FLUFF 4 (GAUZE/BANDAGES/DRESSINGS) IMPLANT
CANISTER SUCT 1200ML W/VALVE (MISCELLANEOUS) ×3 IMPLANT
CHLORAPREP W/TINT 26 (MISCELLANEOUS) ×3 IMPLANT
CLIP APPLIE 11 MED OPEN (CLIP) ×3 IMPLANT
CLIP APPLIE 9.375 MED OPEN (MISCELLANEOUS) IMPLANT
CLIP TI LARGE 6 (CLIP) IMPLANT
CLIP TI MEDIUM 6 (CLIP) ×6 IMPLANT
CLIP TI WIDE RED SMALL 6 (CLIP) IMPLANT
COVER BACK TABLE 60X90IN (DRAPES) ×3 IMPLANT
COVER MAYO STAND STRL (DRAPES) ×3 IMPLANT
COVER PROBE CYLINDRICAL 5X96 (MISCELLANEOUS) ×3 IMPLANT
COVER SURGICAL LIGHT HANDLE (MISCELLANEOUS) IMPLANT
DERMABOND ADVANCED .7 DNX12 (GAUZE/BANDAGES/DRESSINGS) ×6 IMPLANT
DRAIN CHANNEL 15F RND FF W/TCR (WOUND CARE) IMPLANT
DRAIN CHANNEL 19F RND (DRAIN) ×3 IMPLANT
DRAPE INCISE IOBAN 66X45 STRL (DRAPES) IMPLANT
DRAPE LAPAROSCOPIC ABDOMINAL (DRAPES) IMPLANT
DRAPE TOP ARMCOVERS (MISCELLANEOUS) ×3 IMPLANT
DRAPE U-SHAPE 76X120 STRL (DRAPES) ×3 IMPLANT
DRAPE UTILITY XL STRL (DRAPES) ×3 IMPLANT
DRSG TEGADERM 2-3/8X2-3/4 SM (GAUZE/BANDAGES/DRESSINGS) IMPLANT
DRSG TEGADERM 4X10 (GAUZE/BANDAGES/DRESSINGS) IMPLANT
ELECT BLADE 4.0 EZ CLEAN MEGAD (MISCELLANEOUS) ×3 IMPLANT
ELECT BLADE 6.5 EXT (BLADE) IMPLANT
ELECT COATED BLADE 2.86 ST (ELECTRODE) ×3 IMPLANT
ELECT REM PT RETURN 9FT ADLT (ELECTROSURGICAL) ×3 IMPLANT
ELECTRODE BLDE 4.0 EZ CLN MEGD (MISCELLANEOUS) ×3 IMPLANT
ELECTRODE REM PT RTRN 9FT ADLT (ELECTROSURGICAL) ×3 IMPLANT
EVACUATOR SILICONE 100CC (DRAIN) ×3 IMPLANT
EXPANDER TISSUE FORTE 300CC (Breast) IMPLANT
GAUZE PAD ABD 8X10 STRL (GAUZE/BANDAGES/DRESSINGS) ×6 IMPLANT
GAUZE SPONGE 4X4 12PLY STRL (GAUZE/BANDAGES/DRESSINGS) ×3 IMPLANT
GLOVE BIO SURGEON STRL SZ 6 (GLOVE) ×3 IMPLANT
GLOVE BIO SURGEON STRL SZ 6.5 (GLOVE) IMPLANT
GLOVE BIOGEL PI IND STRL 6.5 (GLOVE) ×3 IMPLANT
GLOVE BIOGEL PI IND STRL 7.0 (GLOVE) IMPLANT
GLOVE SURG SS PI 6.5 STRL IVOR (GLOVE) IMPLANT
GOWN STRL REUS W/ TWL LRG LVL3 (GOWN DISPOSABLE) ×9 IMPLANT
GOWN STRL REUS W/ TWL XL LVL3 (GOWN DISPOSABLE) ×6 IMPLANT
ILLUMINATOR WAVEGUIDE N/F (MISCELLANEOUS) IMPLANT
IV NS 500ML BAXH (IV SOLUTION) ×3 IMPLANT
KIT FILL ASEPTIC TRANSFER (MISCELLANEOUS) ×3 IMPLANT
KIT MARKER MARGIN INK (KITS) IMPLANT
LIGHT WAVEGUIDE WIDE FLAT (MISCELLANEOUS) IMPLANT
MARKER SKIN DUAL TIP RULER LAB (MISCELLANEOUS) IMPLANT
NDL HYPO 25X1 1.5 SAFETY (NEEDLE) ×3 IMPLANT
NDL SAFETY ECLIPSE 18X1.5 (NEEDLE) ×3 IMPLANT
NDL SPNL 18GX3.5 QUINCKE PK (NEEDLE) IMPLANT
NDL SPNL 22GX3.5 QUINCKE BK (NEEDLE) IMPLANT
NEEDLE HYPO 25X1 1.5 SAFETY (NEEDLE) ×6 IMPLANT
NEEDLE SPNL 18GX3.5 QUINCKE PK (NEEDLE) IMPLANT
NEEDLE SPNL 22GX3.5 QUINCKE BK (NEEDLE) IMPLANT
NS IRRIG 1000ML POUR BTL (IV SOLUTION) ×3 IMPLANT
PACK BASIN DAY SURGERY FS (CUSTOM PROCEDURE TRAY) ×3 IMPLANT
PACK UNIVERSAL I (CUSTOM PROCEDURE TRAY) ×3 IMPLANT
PENCIL SMOKE EVACUATOR (MISCELLANEOUS) ×3 IMPLANT
PIN SAFETY STERILE (MISCELLANEOUS) ×3 IMPLANT
PUNCH BIOPSY 4MM DISP (MISCELLANEOUS) IMPLANT
SHEET MEDIUM DRAPE 40X70 STRL (DRAPES) ×3 IMPLANT
SLEEVE SCD COMPRESS KNEE MED (STOCKING) ×3 IMPLANT
SPIKE FLUID TRANSFER (MISCELLANEOUS) IMPLANT
SPONGE T-LAP 18X18 ~~LOC~~+RFID (SPONGE) ×6 IMPLANT
STAPLER SKIN PROX WIDE 3.9 (STAPLE) ×3 IMPLANT
STOCKINETTE IMPERVIOUS LG (DRAPES) ×3 IMPLANT
STRIP CLOSURE SKIN 1/2X4 (GAUZE/BANDAGES/DRESSINGS) ×3 IMPLANT
SUT CHROMIC 4 0 RB 1X27 (SUTURE) IMPLANT
SUT ETHIBOND 2-0 V-5 NDL (SUTURE) IMPLANT
SUT ETHIBOND 2-0 V-5 NEEDLE (SUTURE) IMPLANT
SUT ETHILON 2 0 FS 18 (SUTURE) ×3 IMPLANT
SUT ETHILON 3 0 PS 1 (SUTURE) IMPLANT
SUT MNCRL AB 3-0 PS2 18 (SUTURE) IMPLANT
SUT MNCRL AB 4-0 PS2 18 (SUTURE) ×6 IMPLANT
SUT MON AB 5-0 PS2 18 (SUTURE) IMPLANT
SUT PDS AB 2-0 CT2 27 (SUTURE) IMPLANT
SUT SILK 0 TIES 10X30 (SUTURE) IMPLANT
SUT SILK 2 0 SH (SUTURE) ×3 IMPLANT
SUT SILK 3 0 PS 1 (SUTURE) IMPLANT
SUT STRATAFIX 0 PDS 27 VIOLET (SUTURE) ×6 IMPLANT
SUT VIC AB 0 CT2 27 (SUTURE) IMPLANT
SUT VIC AB 3-0 SH 27X BRD (SUTURE) IMPLANT
SUT VIC AB 4-0 PS2 18 (SUTURE) IMPLANT
SUT VICRYL 0 CT-2 (SUTURE) IMPLANT
SUT VICRYL 3-0 CR8 SH (SUTURE) ×6 IMPLANT
SUT VICRYL AB 2 0 TIE (SUTURE) IMPLANT
SUT VICRYL RAPIDE 4/0 PS 2 (SUTURE) IMPLANT
SUTURE STRATFX 0 PDS 27 VIOLET (SUTURE) IMPLANT
SYR 10ML LL (SYRINGE) ×3 IMPLANT
SYR 50ML LL SCALE MARK (SYRINGE) ×3 IMPLANT
SYR BULB IRRIG 60ML STRL (SYRINGE) ×6 IMPLANT
SYR CONTROL 10ML LL (SYRINGE) IMPLANT
TAPE MEASURE VINYL STERILE (MISCELLANEOUS) IMPLANT
TISSUE EXPANDER FORTE 300CC (Breast) ×6 IMPLANT
TOWEL GREEN STERILE FF (TOWEL DISPOSABLE) ×6 IMPLANT
TRACER MAGTRACE VIAL (MISCELLANEOUS) IMPLANT
TRAY DSU PREP LF (CUSTOM PROCEDURE TRAY) IMPLANT
TRAY FOLEY W/BAG SLVR 14FR LF (SET/KITS/TRAYS/PACK) IMPLANT
TUBE CONNECTING 20X1/4 (TUBING) ×3 IMPLANT
UNDERPAD 30X36 HEAVY ABSORB (UNDERPADS AND DIAPERS) ×6 IMPLANT
YANKAUER SUCT BULB TIP NO VENT (SUCTIONS) ×3 IMPLANT

## 2023-11-13 NOTE — Op Note (Signed)
 Operative Note   DATE OF OPERATION: 3.11.2025  LOCATION: [facility]-observation  SURGICAL DIVISION: Plastic Surgery  PREOPERATIVE DIAGNOSES: Right breast cancer UIQ ER+  POSTOPERATIVE DIAGNOSES:  same  PROCEDURE:  1. Bilateral breast reconstruction with tissue expanders 2. Acellular dermis (Alloderm) to bilateral chest   SURGEON: Glenna Fellows MD MBA  ASSISTANT: Elsie Ra RNFA  ANESTHESIA:  General.   EBL: 150 ml for entire procedure  COMPLICATIONS: None immediate.   INDICATIONS FOR PROCEDURE:  The patient, Colleen Golden, is a 58 y.o. female born on 31-Jul-1966, is here for immediate prepectoral tissue expander based reconstruction following nipple sparing mastectomies.    FINDINGS: Natrelle 133S-FV-11-T 300 ml tissue expanders placed bilateral, initial fill volume 150 ml air bilateral. RIGHT SN [id number] LEFT SN [id number]  DESCRIPTION OF PROCEDURE:  The patient was marked with the patient in the preoperative area to mark sternal notch, chest midline, anterior axillary lines and inframammary folds. Following induction, Foley catheter placed. The patient's operative site was prepped and draped in a sterile fashion. A time out was performed and all information was confirmed to be correct. I assisted in mastectomies with exposure and retraction. Following completion of mastectomies, reconstruction began on the left side. Hemostasis ensured. Cavity irrigated with saline solution containing Ancef, gentamicin, and Betadine. A 19 Fr drain was placed in subcutaneous position laterally and a 15 Fr drain placed along inframammary fold. Each secured to skin with 2-0 nylon. The tissue expanders were prepared on back table prior in insertion. The expander was filled with air. Perforated acellular dermis was draped over anterior surface expander. The ADM was then secured to itself over posterior surface of expander with 4-0 chromic. Redundant folds acellular dermis excised so that the ADM lay  flat without folds over air filled expander. The expander was secured to fascia over lateral sternal border with a 0 vicryl. The lateral tab was also secured to pectoralis muscle with 0-vicryl. The ADM was secured to pectoralis muscle and chest wall along inferior border at inframammary fold with 0 Strattafix suture. Laterally the mastectomy flap over posterior axillary line was advanced anteriorly and the subcutaneous tissue and superficial fascia was secured to pectoralis and serratus muscles with 0-vicryl. Skin closure completed with 3-0 vicryl in fascial layer and 4-0 vicryl in dermis. Skin closure completed with 4-0 monocryl subcuticular.   I then directed my attention to left chest where similar irrigation and drain placement completed. The prepared expander with ADM secured over anterior surface was placed in chest and tabs secured to chest wall and pectoralis muscle with 0- vicryl suture. The acellular dermis at inframammary fold was secured to chest wall with 0 Strattafix suture. Laterally the mastectomy flap over posterior axillary line was advanced anteriorly and the subcutaneous tissue and superficial fascia was secured to pectoralis and serratus muscles with 0-vicryl. Skin closure completed with 3-0 vicryl in fascial layer and 4-0 vicryl in dermis. Skin closure completed with 4-0 monocryl subcuticular. Dermabond applied over incisions. Patient brought to upright sitting position, and the mastectomy flaps were redraped so that NAC was symmetric from sternal notch and chest midline. Patient returned to supine position. Tegaderm dressings applied followed by dry dressing, breast binder.   The patient was allowed to wake from anesthesia, extubated and taken to the recovery room in satisfactory condition.   SPECIMENS: none  DRAINS: 15 and 19 Fr JP in right and left subcutaneous chest  Glenna Fellows, MD [facility] Plastic & Reconstructive Surgery  Office/ physician access line after hours  [phone]

## 2023-11-13 NOTE — H&P (Signed)
 REFERRING PHYSICIAN: Elon Spanner  PROVIDER: Matthias Hughs, MD  Care Team: Patient Care Team: Turner Daniels, MD as PCP - General (Obstetrics and Gynecology) Matthias Hughs, MD as Consulting Provider (Surgical Oncology) Buckner Malta, MD (Radiation Oncology) Iruku, Shawn Stall, MD (Hematology and Oncology) Belva Agee   MRN: [id number] DOB: 1966-01-02  Subjective   Chief Complaint: Breast Cancer   History of Present Illness: Colleen Golden is a 58 y.o. female who is seen today as an office consultation at the request of Dr. Al Pimple for evaluation of Breast Cancer .   Patient presents with a new diagnosis of right breast cancer January 2025. She was undergoing high risk screening and got an MR that showed multiple masses. She got several biopsies and only one was malignant. There was a 6 mm mass in the upper inner right breast. Core needle biopsy was performed. Path showed a grade 2 invasive lobular carcinoma, weakly ER+, PR +, and her 2 negative with Ki 67 5%. Of note, the malignant biopsy clip is far posterior and not visible on post clip mammogram.   Patient has not had annual MRIs, but did have one back in 2021. Due to her family history and very dense breast, her OB/GYN recommended that she go and have 1 this year. Has not had any genetic testing past. She does have a fraternal twin sister.  Patient has been on HRT for a while. Stopped tx around 1 week ago and is having hot flashes.   Family cancer history - mother had breast cancer, dad had prostate cancer.  Menarche -14 Menopause Parity G3  Work - works at home.   Breast MR 09/18/23 IMPRESSION: 1. New 0.6 cm far posterior UPPER INNER RIGHT breast mass and new 0.8 cm LOWER OUTER RIGHT breast mass. 2nd-look ultrasound with possible biopsies (if visualized sonographically) recommended. 2. New 0.6 cm linear non masslike enhancement within the anterior UPPER LEFT breast. MR guided biopsy  recommended. 3. No abnormal appearing lymph nodes.  RECOMMENDATION: RIGHT breast ultrasound with possible biopsies of MR detected RIGHT breast masses. If these masses are not visualized sonographically, MR guided RIGHT breast biopsies are recommended.  MR guided biopsy of UPPER LEFT breast linear non masslike enhancement.  BI-RADS CATEGORY 4: Suspicious.   Pathology core needle biopsy: 09/21/23 1. Breast, right, needle core biopsy, 2 o'clock :  INVASIVE LOBULAR CARCINOMA, GRADE 2 (3+2+1)  FOCAL LOBULAR CARCINOMA IN SITU (LCIS)  TUBULAR FORMATION: SCORE 3  NUCLEAR PLEOMORPHISM: SCORE 2  MITOTIC COUNT: SCORE 1  TOTAL SCORE: 6  OVERALL GRADE: GRADE 2 (6/9)  NEGATIVE FOR ANGIOLYMPHATIC INVASION  NEGATIVE FOR MICROCALCIFICATIONS  TUMOR MEASURES 3 MM IN GREATEST LINEAR EXTENT   Receptors: The tumor cells are NEGATIVE for Her2 (1+).  Estrogen Receptor: 50%, POSITIVE, WEAK STAINING INTENSITY  Progesterone Receptor: 100%, POSITIVE, STRONG STAINING INTENSITY  Proliferation Marker Ki67: 5%   Dx mammogram 03/2023 was negative.   Review of Systems: A complete review of systems was obtained from the patient. I have reviewed this information and discussed as appropriate with the patient. See HPI as well for other ROS. ROS - arthritis, thyroid issues.   Medical History: Past Medical History:  Diagnosis Date  Arthritis  Diabetes mellitus without complication (CMS/HHS-HCC)  History of cancer  Thyroid disease   Patient Active Problem List  Diagnosis  Malignant neoplasm of upper-inner quadrant of right breast in female, estrogen receptor positive (CMS/HHS-HCC)  Family history of breast cancer   Past Surgical History:  Procedure Laterality Date  JOINT REPLACEMENT  THYROIDECTOMY TOTAL  2010    No Known Allergies  Current Outpatient Medications on File Prior to Visit  Medication Sig Dispense Refill  levothyroxine (SYNTHROID) 100 MCG tablet Take 1 tablet by mouth every morning  before breakfast (0630)  liothyronine (CYTOMEL) 5 MCG tablet TAKE 3 TABLETS BY MOUTH DAILY ON AN EMPTY STOMACH   No current facility-administered medications on file prior to visit.   Family History  Problem Relation Age of Onset  Hyperlipidemia (Elevated cholesterol) Mother  Diabetes Mother  Breast cancer Mother  Hyperlipidemia (Elevated cholesterol) Father  Coronary Artery Disease (Blocked arteries around heart) Father  Diabetes Father    Social History   Tobacco Use  Smoking Status Never  Smokeless Tobacco Never    Social History   Socioeconomic History  Marital status: Married  Tobacco Use  Smoking status: Never  Smokeless tobacco: Never  Substance and Sexual Activity  Alcohol use: Yes  Drug use: Not Currently   Objective:   Vitals:  BP: 131/75  Pulse: 64  Resp: 16  Temp: 36.5 C (97.7 F)  Weight: 48.5 kg (107 lb)  Height: 162.6 cm (5\' 4" )   Body mass index is 18.37 kg/m.  Gen: No acute distress. Well nourished and well groomed.  Neurological: Alert and oriented to person, place, and time. Coordination normal.  Head: Normocephalic and atraumatic.  Eyes: Conjunctivae are normal. Pupils are equal, round, and reactive to light. No scleral icterus.  Neck: Normal range of motion. Neck supple. No tracheal deviation or thyromegaly present.  Cardiovascular: Normal rate, regular rhythm, normal heart sounds and intact distal pulses. Exam reveals no gallop and no friction rub. No murmur heard. Breast: very dense bilaterally. Right smaller than left. Bruising at biopsy sites. Inverted nipple bilaterally (chronic), no nipple discharge. No LAD. No palpable masses.  Respiratory: Effort normal. No respiratory distress. No chest wall tenderness. Breath sounds normal. No wheezes, rales or rhonchi.  GI: Soft. Bowel sounds are normal. The abdomen is soft and nontender. There is no rebound and no guarding.  Musculoskeletal: Normal range of motion. Extremities are nontender.   Lymphadenopathy: No cervical, preauricular, postauricular or axillary adenopathy is present Skin: Skin is warm and dry. No rash noted. No diaphoresis. No erythema. No pallor. No clubbing, cyanosis, or edema.  Psychiatric: Normal mood and affect. Behavior is normal. Judgment and thought content normal.   Labs CMET, CBC essentially normal 10/03/23  Assessment and Plan:   Malignant neoplasm of upper-inner quadrant of right breast in female, estrogen receptor positive (CMS/HHS-HCC)  Family history of breast cancer  Patient presents with a new diagnosis of right breast cancer, cT1bN0 hormone positive breast cancer. This is amenable to breast conservation if desired.   Had a long discussion with the patient and her spouse. The patient is considering bilateral mastectomies. She does have a small cancer, but has dense breast and would need annual MRI screening. Her mother did have breast cancer 3 times. I will send her to see plastic surgery to discuss pros and cons of reconstruction. She is going to use this discussion and her genetic testing to help her make a decision regarding breast conservation or mastectomy. She does think if she has unilateral mastectomy that she would have bilateral is in order to have symmetry. Also, if she did have unilateral mastectomy she would still require annual breast cancer screening with MRI and alternating mammograms.  She has lobular pathology which also can be tricky in terms of size and  margin.  Discussed that this surgery that we do does not affect the recommendation for chemotherapy. This will be based on possible Oncotype testing. Oncotype testing would be most likely recommended unless the final size of the tumor was smaller than expected. I discussed that mastectomy does not necessarily prevent radiation. I discussed that there is a chance of having a positive margin especially given how close this is to her chest wall. I also reviewed that if lymph nodes are  positive that radiation would be recommended.  In discussions with oncology, mastectomy would also not affect the recommendation to receive tamoxifen postoperatively.  The main thing her surgical decision involves is the future screening.  She is going to let us know her decision after genetic testing and plastic surgery consultation.  I reviewed both surgeries and I reviewed risks/recovery expectations for both. I discussed that mastectomy does carry a higher risk of bleeding with potential return to the operating room or blood transfusion. I discussed risk of infection, chronic pain, chronic numbness, dissatisfaction in the appearance of the breasts whether having mastectomies with or without reconstruction or having lumpectomy. I reviewed sentinel lymph node biopsy and that that would be the same with either procedure. I discussed risk of heart or lung complications or stroke. I reviewed risk of blood clot. I discussed postoperative restrictions and that mastectomies generally require overnight stay in the hospital and 1 drain per side.

## 2023-11-13 NOTE — Interval H&P Note (Signed)
 History and Physical Interval Note:  11/13/2023 8:34 AM  Colleen Golden  has presented today for surgery, with the diagnosis of RIGHT BREAST CANCER, HIGH RISK BREAST CANCER, FAMILY HISTORY, MOTHER WITH BREAST CANCER.  The various methods of treatment have been discussed with the patient and family. After consideration of risks, benefits and other options for treatment, the patient has consented to  Procedure(s) with comments: RIGHT NIPPLE SPARING MASTECTOMY WITH RIGHT SENTINEL LYMPH NODE BIOPSY (Right) - 240 MINUTES PEC BLOCK LEFT NIPPLE SPARING MASTECTOMY (Left) BREAST RECONSTRUCTION WITH PLACEMENT OF TISSUE EXPANDER AND ALLODERM (Bilateral) as a surgical intervention.  The patient's history has been reviewed, patient examined, no change in status, stable for surgery.  I have reviewed the patient's chart and labs.  Questions were answered to the patient's satisfaction.     Almond Lint

## 2023-11-13 NOTE — Anesthesia Procedure Notes (Signed)
 Anesthesia Regional Block: Pectoralis block   Pre-Anesthetic Checklist: , timeout performed,  Correct Patient, Correct Site, Correct Laterality,  Correct Procedure, Correct Position, site marked,  Risks and benefits discussed,  Surgical consent,  Pre-op evaluation,  At surgeon's request and post-op pain management  Laterality: Right  Prep: chloraprep       Needles:  Injection technique: Single-shot  Needle Type: Echogenic Needle     Needle Length: 5cm  Needle Gauge: 21     Additional Needles:   Procedures:,,,, ultrasound used (permanent image in chart),,    Narrative:  Start time: 11/13/2023 7:55 AM End time: 11/13/2023 8:04 AM Injection made incrementally with aspirations every 5 mL.  Performed by: Personally  Anesthesiologist: Trevor Iha, MD  Additional Notes: Block assessed. Patient tolerated procedure well.

## 2023-11-13 NOTE — Transfer of Care (Signed)
 Immediate Anesthesia Transfer of Care Note  Patient: Colleen Golden  Procedure(s) Performed: RIGHT NIPPLE SPARING MASTECTOMY WITH RIGHT SENTINEL LYMPH NODE BIOPSY (Right: Breast) LEFT NIPPLE SPARING MASTECTOMY (Left: Breast) BREAST RECONSTRUCTION WITH PLACEMENT OF TISSUE EXPANDER AND ALLODERM (Bilateral: Breast)  Patient Location: PACU  Anesthesia Type:General  Level of Consciousness: drowsy and patient cooperative  Airway & Oxygen Therapy: Patient Spontanous Breathing and Patient connected to face mask oxygen  Post-op Assessment: Report given to RN and Post -op Vital signs reviewed and stable  Post vital signs: Reviewed and stable  Last Vitals:  Vitals Value Taken Time  BP 130/68 11/13/23 1351  Temp    Pulse 78 11/13/23 1354  Resp 14 11/13/23 1354  SpO2 99 % 11/13/23 1354  Vitals shown include unfiled device data.  Last Pain:  Vitals:   11/13/23 0700  TempSrc: Temporal  PainSc: 0-No pain         Complications: No notable events documented.

## 2023-11-13 NOTE — Op Note (Signed)
 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   Post-operative Diagnosis: same  Surgeon: Almond Lint   Assistant:  Rockwell Germany, RNFA  Anesthesia: General endotracheal anesthesia and pectoral block  ASA Class: 2  Procedure Details  The patient was seen in the Holding Room. The risks, benefits, complications, treatment options, and expected outcomes were discussed with the patient. The possibilities of reaction to medication, pulmonary aspiration, bleeding, infection, the need for additional procedures, failure to diagnose a condition, and creating a complication requiring transfusion or operation were discussed with the patient. The patient concurred with the proposed plan, giving informed consent.  The site of surgery properly noted/marked. The patient was taken to Operating Room # 6, identified as Anzal Bartnick and the procedure verified as bilateral nipple sparing Mastectomies and right axillary Sentinel Node Biopsy. A Time Out was held and the above information confirmed.  The magtrace was injected in the upper outer quadrant location and massaged.    After induction of anesthesia, the bilateral breast and chest were prepped and draped in standard fashion.  The borders of the breast were identified and marked.    The inframammary incisions were drawn out bilaterally.  The left side was addressed first.  The incision was made with the #10 blade.  The dissection was taken through the breast tissue directly down to the serratus fascia at the inframammary fold.  The posterior dissection was performed first.  The pectoralis fascia was  dissected from the muscle and kept with the breast.  This was done with the cautery.  Mastectomy hooks were used to provide elevation of the skin edges, and the cautery was used to create the mastectomy flaps.  The dissection was taken medially to the lateral sternal border, superiorly to the inferior border of the clavicle, and laterally to the border of the latissimus.  The breast was taken off including the pectoralis fascia and the axillary tail marked.  The retroareolar tissue was also marked.  The cavity was irrigated and then hemostasis was achieved with the cautery.  A lap pad soaked with TXA was placed into the cavity.    The right side was then addressed similarly.  The tissue behind the nipple was a bit thick, so an additional section was sent for margin.    A 3 cm incision was made in the axilla.  Using a sentimag probe, axillary sentinel nodes were identified.  Two deep level 2 axillary sentinel nodes were removed and submitted to pathology.  The findings are below.  The lymphovascular channels were clipped with metal clips.        The wound was irrigated.  Hemostasis was achieved with cautery. The skin of the axillary incision was closed with a 3-0 Vicryl deep dermal interrupted sutures and 4-0 Vicryl subcuticular closure in layers.  Both breast cavities were reinspected for hemostasis and several small skin vessels were cauterized.    The patient was left with Dr. Leta Baptist for performance of the reconstruction.    Findings: grossly clear surgical margins, nodes: small.  #1 cps 1800, #2 cps 1300  Estimated Blood Loss: 50 mL          Drains: 19 Fr blake drains  bilaterally per Dr. Leta Baptist                Specimens: left breast, right breast, right retroareolar tissue, right axillary SLN #1, right axillary SLN #2         Complications:  None; patient tolerated the procedure well.         Disposition: PACU - hemodynamically stable.         Condition: stable

## 2023-11-13 NOTE — Anesthesia Procedure Notes (Signed)
 Procedure Name: Intubation Date/Time: 11/13/2023 8:52 AM  Performed by: Lauralyn Primes, CRNAPre-anesthesia Checklist: Patient identified, Emergency Drugs available, Suction available and Patient being monitored Patient Re-evaluated:Patient Re-evaluated prior to induction Oxygen Delivery Method: Circle system utilized Preoxygenation: Pre-oxygenation with 100% oxygen Induction Type: IV induction Ventilation: Mask ventilation without difficulty Laryngoscope Size: Mac and 3 Grade View: Grade I Tube type: Oral Tube size: 7.0 mm Number of attempts: 1 Airway Equipment and Method: Stylet and Bite block Placement Confirmation: ETT inserted through vocal cords under direct vision, positive ETCO2 and breath sounds checked- equal and bilateral Secured at: 21 cm Tube secured with: Tape Dental Injury: Teeth and Oropharynx as per pre-operative assessment

## 2023-11-13 NOTE — Progress Notes (Signed)
Assisted Dr. Valma Cava with right, pectoralis, ultrasound guided block. Side rails up, monitors on throughout procedure. See vital signs in flow sheet. Tolerated Procedure well.

## 2023-11-13 NOTE — Anesthesia Postprocedure Evaluation (Signed)
 Anesthesia Post Note  Patient: Colleen Golden  Procedure(s) Performed: RIGHT NIPPLE SPARING MASTECTOMY WITH RIGHT SENTINEL LYMPH NODE BIOPSY (Right: Breast) LEFT NIPPLE SPARING MASTECTOMY (Left: Breast) BREAST RECONSTRUCTION WITH PLACEMENT OF TISSUE EXPANDER AND ALLODERM (Bilateral: Breast)     Patient location during evaluation: PACU Anesthesia Type: Regional and General Level of consciousness: awake and alert Pain management: pain level controlled Vital Signs Assessment: post-procedure vital signs reviewed and stable Respiratory status: spontaneous breathing, nonlabored ventilation, respiratory function stable and patient connected to nasal cannula oxygen Cardiovascular status: blood pressure returned to baseline and stable Postop Assessment: no apparent nausea or vomiting Anesthetic complications: no  No notable events documented.  Last Vitals:  Vitals:   11/13/23 1513 11/13/23 1532  BP: 122/71 104/67  Pulse: 73 71  Resp: 12 16  Temp:    SpO2: 98%     Last Pain:  Vitals:   11/13/23 1430  TempSrc:   PainSc: Asleep                 Trevor Iha

## 2023-11-13 NOTE — Interval H&P Note (Signed)
 History and Physical Interval Note:  11/13/2023 7:11 AM  Colleen Golden  has presented today for surgery, with the diagnosis of RIGHT BREAST CANCER, HIGH RISK BREAST CANCER, FAMILY HISTORY, MOTHER WITH BREAST CANCER.  The various methods of treatment have been discussed with the patient and family. After consideration of risks, benefits and other options for treatment, the patient has consented to  Procedure(s) with comments: RIGHT NIPPLE SPARING MASTECTOMY WITH RIGHT SENTINEL LYMPH NODE BIOPSY (Right) - 240 MINUTES PEC BLOCK LEFT NIPPLE SPARING MASTECTOMY (Left) BREAST RECONSTRUCTION WITH PLACEMENT OF TISSUE EXPANDER AND ALLODERM (Bilateral) as a surgical intervention.  The patient's history has been reviewed, patient examined, no change in status, stable for surgery.  I have reviewed the patient's chart and labs.  Questions were answered to the patient's satisfaction.     Irean Hong Adrien Shankar

## 2023-11-14 ENCOUNTER — Encounter (HOSPITAL_BASED_OUTPATIENT_CLINIC_OR_DEPARTMENT_OTHER): Payer: Self-pay | Admitting: General Surgery

## 2023-11-14 DIAGNOSIS — C50211 Malignant neoplasm of upper-inner quadrant of right female breast: Secondary | ICD-10-CM | POA: Diagnosis not present

## 2023-11-14 NOTE — Discharge Summary (Signed)
 Physician Discharge Summary  Patient ID: Colleen Golden MRN: [id number] DOB/AGE: 56-May-1967 56 y.o.  Admit date: 11/13/2023 Discharge date: 11/14/2023  Admission Diagnoses: Right breast cancer  Discharge Diagnoses:  same  Discharged Condition: stable  Hospital Course: Post operatively patient did well with pain controlled, tolerating diet, ambulatory with minimal assist. Patient instructed on drain care and bathing.  Treatments: surgery: bilateral nipple sparing mastectomies right sentinel node bilateral breast reconstruction with tissue expanders acellular dermis 3.11.2025  Discharge Exam: Blood pressure 94/77, pulse 67, temperature 98.2 F (36.8 C), resp. rate 16, height 5\' 4"  (1.626 m), weight 47.4 kg, SpO2 99%. Incision/Wound: Chest soft Tegaderms in place incisions intact skin flaps viable drains serosanguinous  Disposition: Discharge disposition: 01-Home or Self Care       Discharge Instructions     Call MD for:  redness, tenderness, or signs of infection (pain, swelling, bleeding, redness, odor or green/yellow discharge around incision site)   Complete by: As directed    Call MD for:  temperature >100.5   Complete by: As directed    Discharge instructions   Complete by: As directed    Ok to remove dressings and shower am 3.13.25. Soap and water ok, pat Tegaderms dry. Do not remove Tegaderms. No creams or ointments over incisions. Do not let drains dangle in shower, attach to lanyard or similar. Strip and record drains twice daily and bring log to clinic visit.  Breast binder or soft compression bra all other times.  Ok to raise arms above shoulders for bathing and dressing.  No house yard work or exercise until cleared by MD.   Recommend ibuprofen with meals to aid with pain control. Also ok to use Tylenol for pain control. Patient received all Rx preop.   Driving Restrictions   Complete by: As directed    No driving for 2 weeks then no driving if taking  prescription pain medication   Lifting restrictions   Complete by: As directed    No lifting > 5-10 lbs until cleared by MD   Resume previous diet   Complete by: As directed       Allergies as of 11/14/2023   No Known Allergies      Medication List     TAKE these medications    levothyroxine 100 MCG tablet Commonly known as: SYNTHROID Take 100 mcg by mouth daily before breakfast.   liothyronine 5 MCG tablet Commonly known as: CYTOMEL Take 5 mcg by mouth daily.   multivitamin capsule Take 1 capsule by mouth daily.        Follow-up Information     Colleen Fellows, MD Follow up in 1 week(s).   Specialty: Plastic Surgery Why: as scheduled Contact information: [address], [address] [place] [address] [phone]         Almond Lint, MD Follow up in 2 week(s).   Specialty: General Surgery Why: as scheduled Contact information: [address] [place] [address] [phone]                 Signed: Glenna Golden 11/14/2023, 7:53 AM

## 2023-11-14 NOTE — Discharge Instructions (Signed)
 About my Jackson-Pratt Bulb Drain   What is a Jackson-Pratt bulb? A Jackson-Pratt is a soft, round device used to collect drainage. It is connected to a long, thin drainage catheter, which is held in place by one or two small stiches near your surgical incision site. When the bulb is squeezed, it forms a vacuum, forcing the drainage to empty into the bulb.  Emptying the Jackson-Pratt bulb- To empty the bulb: 1. Release the plug on the top of the bulb. 2. Pour the bulb's contents into a measuring container which your nurse will provide. 3. Record the time emptied and amount of drainage. Empty the drain(s) as often as your     doctor or nurse recommends.  Date                  Time                    Amount (Drain 1)                 Amount (Drain 2)  _____________________________________________________________________  _____________________________________________________________________  _____________________________________________________________________  _____________________________________________________________________  _____________________________________________________________________  _____________________________________________________________________  _____________________________________________________________________  _____________________________________________________________________  Squeezing the Jackson-Pratt Bulb- To squeeze the bulb: 1. Make sure the plug at the top of the bulb is open. 2. Squeeze the bulb tightly in your fist. You will hear air squeezing from the bulb. 3. Replace the plug while the bulb is squeezed. 4. Use a safety pin to attach the bulb to your clothing. This will keep the catheter from     pulling at the bulb insertion site.  When to call your doctor- Call your doctor if: Drain site becomes red, swollen or hot. You have a fever greater than 101 degrees F. There is oozing at the drain site. Drain falls out (apply a guaze bandage  over the drain hole and secure it with tape). Drainage increases daily not related to activity patterns. (You will usually have more drainage when you are active than when you are resting.) Drainage has a bad odor.  Information for Discharge Teaching: EXPAREL (bupivacaine liposome injectable suspension)   Pain relief is important to your recovery. The goal is to control your pain so you can move easier and return to your normal activities as soon as possible after your procedure. Your physician may use several types of medicines to manage pain, swelling, and more.  Your surgeon or anesthesiologist gave you EXPAREL(bupivacaine) to help control your pain after surgery.  EXPAREL is a local anesthetic designed to release slowly over an extended period of time to provide pain relief by numbing the tissue around the surgical site. EXPAREL is designed to release pain medication over time and can control pain for up to 72 hours. Depending on how you respond to EXPAREL, you may require less pain medication during your recovery. EXPAREL can help reduce or eliminate the need for opioids during the first few days after surgery when pain relief is needed the most. EXPAREL is not an opioid and is not addictive. It does not cause sleepiness or sedation.   Important! A teal colored band has been placed on your arm with the date, time and amount of EXPAREL you have received. Please leave this armband in place for the full 96 hours following administration, and then you may remove the band. If you return to the hospital for any reason within 96 hours following the administration of EXPAREL, the armband provides important information that your health care providers to know,  and alerts them that you have received this anesthetic.    Possible side effects of EXPAREL: Temporary loss of sensation or ability to move in the area where medication was injected. Nausea, vomiting, constipation Rarely, numbness and tingling  in your mouth or lips, lightheadedness, or anxiety may occur. Call your doctor right away if you think you may be experiencing any of these sensations, or if you have other questions regarding possible side effects.  Follow all other discharge instructions given to you by your surgeon or nurse. Eat a healthy diet and drink plenty of water or other fluids.

## 2023-11-19 ENCOUNTER — Encounter: Payer: Self-pay | Admitting: *Deleted

## 2023-11-21 LAB — SURGICAL PATHOLOGY

## 2023-11-22 ENCOUNTER — Telehealth: Payer: Self-pay | Admitting: *Deleted

## 2023-11-22 ENCOUNTER — Encounter: Payer: Self-pay | Admitting: *Deleted

## 2023-11-22 NOTE — Telephone Encounter (Signed)
Received order for Oncotype Testing per Dr. Chryl Heck. Requisition faxed to pathology and [facility]

## 2023-12-01 ENCOUNTER — Telehealth: Payer: Self-pay | Admitting: Hematology and Oncology

## 2023-12-01 NOTE — Telephone Encounter (Signed)
 Spoke with patient confirming upcoming appointment

## 2023-12-03 ENCOUNTER — Encounter (HOSPITAL_COMMUNITY): Payer: Self-pay

## 2023-12-03 ENCOUNTER — Encounter: Payer: Self-pay | Admitting: *Deleted

## 2023-12-04 NOTE — Progress Notes (Unsigned)
 [facility] CONSULT NOTE  Patient Care Team: Candice Camp, MD as PCP - General (Obstetrics and Gynecology) Pershing Proud, RN as Oncology Nurse Navigator Donnelly Angelica, RN as Oncology Nurse Navigator Rachel Moulds, MD as Consulting Physician (Hematology and Oncology)  CHIEF COMPLAINTS/PURPOSE OF CONSULTATION:  Newly diagnosed breast cancer  HISTORY OF PRESENTING ILLNESS:  Colleen Golden 58 y.o. female is here because of recent diagnosis of right breast cancer  I reviewed her records extensively and collaborated the history with the patient.  SUMMARY OF ONCOLOGIC HISTORY: Oncology History  Malignant neoplasm of upper-inner quadrant of right breast in female, estrogen receptor positive (HCC)  09/18/2023 Breast MRI   New 0.6 cm far posterior UPPER INNER RIGHT breast mass and new 0.8 cm LOWER OUTER RIGHT breast mass. 2nd-look ultrasound with possible biopsies (if visualized sonographically) recommended. New 0.6 cm linear non masslike enhancement within the anterior UPPER LEFT breast. MR guided biopsy recommended. No abnormal appearing lymph nodes.     09/21/2023 Pathology Results   Biopsy from the right breast 2:00 showed grade 2 invasive lobular carcinoma, focal LCIS, estrogen 50% positive weak staining intensity, progesterone receptor 100% positive strong staining intensity, HER2 1+ Ki-67 of 5%   10/01/2023 Initial Diagnosis   Malignant neoplasm of upper-inner quadrant of right breast in female, estrogen receptor positive (HCC)   10/03/2023 Cancer Staging   Staging form: Breast, AJCC 8th Edition - Clinical stage from 10/03/2023: Stage IA (cT1b, cN0, cM0, G2, ER+, PR+, HER2-) - Signed by Rachel Moulds, MD on 10/03/2023 Stage prefix: Initial diagnosis Histologic grading system: 3 grade system Laterality: Right Staged by: Pathologist and managing physician Stage used in treatment planning: Yes National guidelines used in treatment planning: Yes Type of  national guideline used in treatment planning: NCCN   10/11/2023 Genetic Testing   Negative [facility] CancerNext-Expanded +RNAinsight Panel.  VUS in POLE at c.6531+4C>T. Report date is 10/11/2023.   The CancerNext-Expanded gene panel offered by [facility] and includes sequencing, rearrangement, and RNA analysis for the following 76 genes: AIP, ALK, APC, ATM, AXIN2, BAP1, BARD1, BMPR1A, BRCA1, BRCA2, BRIP1, CDC73, CDH1, CDK4, CDKN1B, CDKN2A, CEBPA, CHEK2, CTNNA1, DDX41, DICER1, ETV6, FH, FLCN, GATA2, LZTR1, MAX, MBD4, MEN1, MET, MLH1, MSH2, MSH3, MSH6, MUTYH, NF1, NF2, NTHL1, PALB2, PHOX2B, PMS2, POT1, PRKAR1A, PTCH1, PTEN, RAD51C, RAD51D, RB1, RET, RUNX1, SDHA, SDHAF2, SDHB, SDHC, SDHD, SMAD4, SMARCA4, SMARCB1, SMARCE1, STK11, SUFU, TMEM127, TP53, TSC1, TSC2, VHL, and WT1 (sequencing and deletion/duplication); EGFR, HOXB13, KIT, MITF, PDGFRA, POLD1, and POLE (sequencing only); EPCAM and GREM1 (deletion/duplication only).    11/13/2023 Definitive Surgery   She had bilateral mastectomy which showed grade 2 ILC in the right breast measuring 1 cm LCIS, neg margins, ER positive, PR positive, Her 2 neg, Ki 67 5%. 5/5 LN without any evidence of malignancy.   11/13/2023 Oncotype testing   Oncotype Dx of 18, no added benefit of chemotherapy.    Discussed the use of AI scribe software for clinical note transcription with the patient, who gave verbal consent to proceed.  History of Present Illness  Colleen Golden is a 58 year old female who presents for post-operative follow-up after bilateral mastectomy.  She underwent a bilateral mastectomy three weeks ago for invasive lobular cancer. Initially, the post-surgical period was challenging, but she reports feeling better recently with reduced soreness. She had her first expander fill, which she describes as painful and notes a significant increase in size compared to before surgery. She was previously a full B cup and  does not wish to increase her size  further.  The surgical pathology revealed a one centimeter tumor in the right breast with clear margins and no cancer in the five sentinel lymph nodes removed. Her oncotype score was 18, indicating no benefit from chemotherapy.  She has a history of hormone replacement therapy, which she stopped without significant menopausal symptoms, except for increased hair growth and occasional sweating.  There is uncertainty about her menopausal status due to spotting attributed to the uterine polyp.  Family history includes her husband having colon cancer, but she tested negative for the BRCA gene mutation. She has three children, aged 36, 109, and 56, and is considering genetic counseling for them due to her cancer history.  Rest of the pertinent 10 point ROS reviewed and neg.  MEDICAL HISTORY:  Past Medical History:  Diagnosis Date   Arthritis    Cancer [facility])    Breast   Pre-diabetes    Thyroid disease     SURGICAL HISTORY: Past Surgical History:  Procedure Laterality Date   BREAST BIOPSY Right 09/21/2023   [place] RT BREAST BX W LOC DEV 1ST LESION IMG BX SPEC US GUIDE 09/21/2023 [facility] MAMMOGRAPHY   BREAST RECONSTRUCTION WITH PLACEMENT OF TISSUE EXPANDER AND ALLODERM Bilateral 11/13/2023   Procedure: BREAST RECONSTRUCTION WITH PLACEMENT OF TISSUE EXPANDER AND ALLODERM;  Surgeon: Glenna Fellows, MD;  Location: [facility];  Service: Plastics;  Laterality: Bilateral;   DILATION AND CURETTAGE OF UTERUS     NIPPLE SPARING MASTECTOMY Left 11/13/2023   Procedure: LEFT NIPPLE SPARING MASTECTOMY;  Surgeon: Almond Lint, MD;  Location: [facility];  Service: General;  Laterality: Left;   NIPPLE SPARING MASTECTOMY WITH SENTINEL LYMPH NODE BIOPSY Right 11/13/2023   Procedure: RIGHT NIPPLE SPARING MASTECTOMY WITH RIGHT SENTINEL LYMPH NODE BIOPSY;  Surgeon: Almond Lint, MD;  Location: [facility];  Service: General;  Laterality: Right;   THYROIDECTOMY N/A 05/29/2013    Procedure: TOTAL THYROIDECTOMY;  Surgeon: Velora Heckler, MD;  Location: [facility];  Service: General;  Laterality: N/A;   TONSILLECTOMY     TOTAL HIP ARTHROPLASTY Right 2010    SOCIAL HISTORY: Social History   Socioeconomic History   Marital status: Married    Spouse name: Not on file   Number of children: Not on file   Years of education: Not on file   Highest education level: Not on file  Occupational History   Not on file  Tobacco Use   Smoking status: Never   Smokeless tobacco: Never  Vaping Use   Vaping status: Never Used  Substance and Sexual Activity   Alcohol use: Yes    Comment: 2/3 a week   Drug use: No   Sexual activity: Not on file  Other Topics Concern   Not on file  Social History Narrative   Not on file   Social Drivers of Health   Financial Resource Strain: Not on file  Food Insecurity: No Food Insecurity (10/03/2023)   Hunger Vital Sign    Worried About Running Out of Food in the Last Year: Never true    Ran Out of Food in the Last Year: Never true  Transportation Needs: No Transportation Needs (10/03/2023)   PRAPARE - Administrator, Civil Service (Medical): No    Lack of Transportation (Non-Medical): No  Physical Activity: Not on file  Stress: Not on file  Social Connections: Not on file  Intimate Partner Violence: Not At Risk (10/03/2023)  Humiliation, Afraid, Rape, and Kick questionnaire    Fear of Current or Ex-Partner: No    Emotionally Abused: No    Physically Abused: No    Sexually Abused: No    FAMILY HISTORY: Family History  Problem Relation Age of Onset   Breast cancer Mother 95       lumpectomy, x2 re-excisions   Prostate cancer Father        dx late 70s; recurrence w/ mets in 4s   Breast cancer Paternal Aunt        dx >50; bil mast   Breast cancer Paternal Aunt        dx 58s; d. 72s   Breast cancer Maternal Grandmother        dx before age 67   Breast cancer Cousin        pat female cousin; dx <50   Colon  cancer Neg Hx    Colon polyps Neg Hx    Esophageal cancer Neg Hx    Stomach cancer Neg Hx     ALLERGIES:  has no known allergies.  MEDICATIONS:  Current Outpatient Medications  Medication Sig Dispense Refill   levothyroxine (SYNTHROID) 100 MCG tablet Take 100 mcg by mouth daily before breakfast.     liothyronine (CYTOMEL) 5 MCG tablet Take 5 mcg by mouth daily.     Multiple Vitamin (MULTIVITAMIN) capsule Take 1 capsule by mouth daily.     No current facility-administered medications for this visit.    REVIEW OF SYSTEMS:   Constitutional: Denies fevers, chills or abnormal night sweats Eyes: Denies blurriness of vision, double vision or watery eyes Ears, nose, mouth, throat, and face: Denies mucositis or sore throat Respiratory: Denies cough, dyspnea or wheezes Cardiovascular: Denies palpitation, chest discomfort or lower extremity swelling Gastrointestinal:  Denies nausea, heartburn or change in bowel habits Skin: Denies abnormal skin rashes Lymphatics: Denies new lymphadenopathy or easy bruising Neurological:Denies numbness, tingling or new weaknesses Behavioral/Psych: Mood is stable, no new changes  Breast: Denies any palpable lumps or discharge All other systems were reviewed with the patient and are negative.  PHYSICAL EXAMINATION: ECOG PERFORMANCE STATUS: 0 - Asymptomatic  Vitals:   12/05/23 1517  BP: 123/76  Pulse: 72  Resp: 16  Temp: 98.1 F (36.7 C)  SpO2: 100%    Filed Weights   12/05/23 1517  Weight: 107 lb 9.6 oz (48.8 kg)     GENERAL:alert, no distress and comfortable  LABORATORY DATA:  I have reviewed the data as listed Lab Results  Component Value Date   WBC 6.0 10/03/2023   HGB 15.8 (H) 10/03/2023   HCT 46.6 (H) 10/03/2023   MCV 88.8 10/03/2023   PLT 311 10/03/2023   Lab Results  Component Value Date   NA 138 10/03/2023   K 3.9 10/03/2023   CL 103 10/03/2023   CO2 28 10/03/2023    RADIOGRAPHIC STUDIES: I have personally reviewed  the radiological reports and agreed with the findings in the report.  ASSESSMENT AND PLAN:  Malignant neoplasm of upper-inner quadrant of right breast in female, estrogen receptor positive (HCC)  Assessment & Plan Invasive Lobular Carcinoma of the Right Breast Post bilateral mastectomy with reconstruction. 1 cm carcinoma with clear margins, no lymph node involvement. Oncotype score 18, no chemotherapy benefit. Recurrence risk 5% with antiestrogen therapy, 8-9% without. Radiation not required. Antiestrogen therapy necessary due to lobular histology - Monitor post-mastectomy and reconstruction healing. - Discuss antiestrogen therapy options based on menopausal status. - Order estradiol and LH/FSH  levels to determine menopausal status. - Consider letrozole if postmenopausal, avoiding tamoxifen due to uterine polyp concerns. - Schedule follow-up to discuss lab results and finalize antiestrogen therapy plan.  Menopausal Status Uncertainty Uncertainty affects antiestrogen therapy choice. Previous hormone replacement and recent spotting due to uterine polyp.  - Order estradiol and LH/FSH levels to assess menopausal status. - Discuss results and treatment options once lab results are available.  Family History of Cancer Negative for BRCA mutations.  Children should consider high-risk breast clinic evaluation. - Advise children to discuss breast cancer risk with their primary care provider and consider referral to a high-risk breast clinic.  Follow-up Scheduled for follow-up after surgery in May.     Time spent: 30 minutes including history, review of records, discussion in tumor board, over 45 minutes of face-to-face time and documentation All questions were answered. The patient knows to call the clinic with any problems, questions or concerns.    Rachel Moulds, MD 12/05/23

## 2023-12-05 ENCOUNTER — Inpatient Hospital Stay: Attending: Hematology and Oncology | Admitting: Hematology and Oncology

## 2023-12-05 ENCOUNTER — Inpatient Hospital Stay

## 2023-12-05 VITALS — BP 123/76 | HR 72 | Temp 98.1°F | Resp 16 | Wt 107.6 lb

## 2023-12-05 DIAGNOSIS — N840 Polyp of corpus uteri: Secondary | ICD-10-CM | POA: Insufficient documentation

## 2023-12-05 DIAGNOSIS — Z8042 Family history of malignant neoplasm of prostate: Secondary | ICD-10-CM | POA: Diagnosis not present

## 2023-12-05 DIAGNOSIS — Z1721 Progesterone receptor positive status: Secondary | ICD-10-CM | POA: Insufficient documentation

## 2023-12-05 DIAGNOSIS — C50211 Malignant neoplasm of upper-inner quadrant of right female breast: Secondary | ICD-10-CM | POA: Diagnosis present

## 2023-12-05 DIAGNOSIS — Z1732 Human epidermal growth factor receptor 2 negative status: Secondary | ICD-10-CM | POA: Insufficient documentation

## 2023-12-05 DIAGNOSIS — Z9013 Acquired absence of bilateral breasts and nipples: Secondary | ICD-10-CM | POA: Insufficient documentation

## 2023-12-05 DIAGNOSIS — Z170 Estrogen receptor positive status [ER+]: Secondary | ICD-10-CM | POA: Insufficient documentation

## 2023-12-05 DIAGNOSIS — Z803 Family history of malignant neoplasm of breast: Secondary | ICD-10-CM | POA: Diagnosis not present

## 2023-12-05 DIAGNOSIS — Z780 Asymptomatic menopausal state: Secondary | ICD-10-CM | POA: Diagnosis not present

## 2023-12-05 NOTE — Assessment & Plan Note (Signed)
  Assessment & Plan Invasive Lobular Carcinoma of the Right Breast Post bilateral mastectomy with reconstruction. 1 cm carcinoma with clear margins, no lymph node involvement. Oncotype score 18, no chemotherapy benefit. Recurrence risk 5% with antiestrogen therapy, 8-9% without. Radiation not required. Antiestrogen therapy necessary due to lobular histology - Monitor post-mastectomy and reconstruction healing. - Discuss antiestrogen therapy options based on menopausal status. - Order estradiol and LH/FSH levels to determine menopausal status. - Consider letrozole if postmenopausal, avoiding tamoxifen due to uterine polyp concerns. - Schedule follow-up to discuss lab results and finalize antiestrogen therapy plan.  Menopausal Status Uncertainty Uncertainty affects antiestrogen therapy choice. Previous hormone replacement and recent spotting due to uterine polyp.  - Order estradiol and LH/FSH levels to assess menopausal status. - Discuss results and treatment options once lab results are available.  Family History of Cancer Negative for BRCA mutations.  Children should consider high-risk breast clinic evaluation. - Advise children to discuss breast cancer risk with their primary care provider and consider referral to a high-risk breast clinic.  Follow-up Scheduled for follow-up after surgery in May.

## 2023-12-06 ENCOUNTER — Telehealth: Payer: Self-pay

## 2023-12-06 ENCOUNTER — Ambulatory Visit: Admitting: Hematology and Oncology

## 2023-12-06 LAB — LUTEINIZING HORMONE: LH: 31.9 m[IU]/mL

## 2023-12-06 LAB — FOLLICLE STIMULATING HORMONE: FSH: 39 m[IU]/mL

## 2023-12-06 LAB — ESTRADIOL: Estradiol: 61.8 pg/mL

## 2023-12-06 NOTE — Telephone Encounter (Signed)
-----   Message from Colleen Golden sent at 12/06/2023  1:09 PM EDT ----- Labs are not consistent with menopause, please let her know.

## 2023-12-06 NOTE — Telephone Encounter (Signed)
 Attempted to call pt per MD to review results. LVM for call back.

## 2023-12-10 ENCOUNTER — Encounter: Payer: Self-pay | Admitting: *Deleted

## 2023-12-10 ENCOUNTER — Ambulatory Visit: Payer: Managed Care, Other (non HMO) | Admitting: Hematology and Oncology

## 2023-12-13 ENCOUNTER — Ambulatory Visit: Admitting: Physical Therapy

## 2023-12-14 ENCOUNTER — Inpatient Hospital Stay (HOSPITAL_BASED_OUTPATIENT_CLINIC_OR_DEPARTMENT_OTHER): Admitting: Hematology and Oncology

## 2023-12-14 DIAGNOSIS — C50211 Malignant neoplasm of upper-inner quadrant of right female breast: Secondary | ICD-10-CM

## 2023-12-14 DIAGNOSIS — Z170 Estrogen receptor positive status [ER+]: Secondary | ICD-10-CM | POA: Diagnosis not present

## 2023-12-14 NOTE — Progress Notes (Signed)
  [facility] CONSULT [facility]  Patient Care Team: Candice Camp, MD as PCP - General (Obstetrics and Gynecology) Pershing Proud, RN as Oncology Nurse Navigator Donnelly Angelica, RN as Oncology Nurse Navigator Rachel Moulds, MD as Consulting Physician (Hematology and Oncology)  CHIEF COMPLAINTS/PURPOSE OF CONSULTATION:  Newly diagnosed breast cancer  HISTORY OF PRESENTING ILLNESS:  Colleen Golden 58 y.o. female is here because of recent diagnosis of right breast cancer  I reviewed her records extensively and collaborated the history with the patient.  SUMMARY OF ONCOLOGIC HISTORY: Oncology History  Malignant neoplasm of upper-inner quadrant of right breast in female, estrogen receptor positive (HCC)  09/18/2023 Breast MRI   New 0.6 cm far posterior UPPER INNER RIGHT breast mass and new 0.8 cm LOWER OUTER RIGHT breast mass. 2nd-look ultrasound with possible biopsies (if visualized sonographically) recommended. New 0.6 cm linear non masslike enhancement within the anterior UPPER LEFT breast. MR guided biopsy recommended. No abnormal appearing lymph nodes.     09/21/2023 Pathology Results   Biopsy from the right breast 2:00 showed grade 2 invasive lobular carcinoma, focal LCIS, estrogen 50% positive weak staining intensity, progesterone receptor 100% positive strong staining intensity, HER2 1+ Ki-67 of 5%   10/01/2023 Initial Diagnosis   Malignant neoplasm of upper-inner quadrant of right breast in female, estrogen receptor positive (HCC)   10/03/2023 Cancer Staging   Staging form: Breast, AJCC 8th Edition - Clinical stage from 10/03/2023: Stage IA (cT1b, cN0, cM0, G2, ER+, PR+, HER2-) - Signed by Rachel Moulds, MD on 10/03/2023 Stage prefix: Initial diagnosis Histologic grading system: 3 grade system Laterality: Right Staged by: Pathologist and managing physician Stage used in treatment planning: Yes National guidelines used in treatment planning: Yes Type of  national guideline used in treatment planning: NCCN   10/11/2023 Genetic Testing   Negative [facility] CancerNext-Expanded +RNAinsight Panel.  VUS in POLE at c.6531+4C>T. Report date is 10/11/2023.   The CancerNext-Expanded gene panel offered by [facility] and includes sequencing, rearrangement, and RNA analysis for the following 76 genes: AIP, ALK, APC, ATM, AXIN2, BAP1, BARD1, BMPR1A, BRCA1, BRCA2, BRIP1, CDC73, CDH1, CDK4, CDKN1B, CDKN2A, CEBPA, CHEK2, CTNNA1, DDX41, DICER1, ETV6, FH, FLCN, GATA2, LZTR1, MAX, MBD4, MEN1, MET, MLH1, MSH2, MSH3, MSH6, MUTYH, NF1, NF2, NTHL1, PALB2, PHOX2B, PMS2, POT1, PRKAR1A, PTCH1, PTEN, RAD51C, RAD51D, RB1, RET, RUNX1, SDHA, SDHAF2, SDHB, SDHC, SDHD, SMAD4, SMARCA4, SMARCB1, SMARCE1, STK11, SUFU, TMEM127, TP53, TSC1, TSC2, VHL, and WT1 (sequencing and deletion/duplication); EGFR, HOXB13, KIT, MITF, PDGFRA, POLD1, and POLE (sequencing only); EPCAM and GREM1 (deletion/duplication only).    11/13/2023 Definitive Surgery   She had bilateral mastectomy which showed grade 2 ILC in the right breast measuring 1 cm LCIS, neg margins, ER positive, PR positive, Her 2 neg, Ki 67 5%. 5/5 LN without any evidence of malignancy.   11/13/2023 Oncotype testing   Oncotype Dx of 18, no added benefit of chemotherapy.    Discussed the use of AI scribe software for clinical note transcription with the patient, who gave verbal consent to proceed.  History of Present Illness  Colleen Golden is a 58 year old female who presents for telephone follow up.  She has uterine polyps surgery scheduled in May and she is understandably worried about the effects of tamoxifen on this. Rest of the pertinent 10 point ROS reviewed and neg.  MEDICAL HISTORY:  Past Medical History:  Diagnosis Date   Arthritis    Cancer [facility])    Breast   Pre-diabetes    Thyroid  disease     SURGICAL HISTORY: Past Surgical History:  Procedure Laterality Date   BREAST BIOPSY Right 09/21/2023   [place] RT BREAST BX  W LOC DEV 1ST LESION IMG BX SPEC US GUIDE 09/21/2023 [facility] MAMMOGRAPHY   BREAST RECONSTRUCTION WITH PLACEMENT OF TISSUE EXPANDER AND ALLODERM Bilateral 11/13/2023   Procedure: BREAST RECONSTRUCTION WITH PLACEMENT OF TISSUE EXPANDER AND ALLODERM;  Surgeon: Glenna Fellows, MD;  Location: [facility];  Service: Plastics;  Laterality: Bilateral;   DILATION AND CURETTAGE OF UTERUS     NIPPLE SPARING MASTECTOMY Left 11/13/2023   Procedure: LEFT NIPPLE SPARING MASTECTOMY;  Surgeon: Almond Lint, MD;  Location: [facility];  Service: General;  Laterality: Left;   NIPPLE SPARING MASTECTOMY WITH SENTINEL LYMPH NODE BIOPSY Right 11/13/2023   Procedure: RIGHT NIPPLE SPARING MASTECTOMY WITH RIGHT SENTINEL LYMPH NODE BIOPSY;  Surgeon: Almond Lint, MD;  Location: [facility];  Service: General;  Laterality: Right;   THYROIDECTOMY N/A 05/29/2013   Procedure: TOTAL THYROIDECTOMY;  Surgeon: Velora Heckler, MD;  Location: [facility];  Service: General;  Laterality: N/A;   TONSILLECTOMY     TOTAL HIP ARTHROPLASTY Right 2010    SOCIAL HISTORY: Social History   Socioeconomic History   Marital status: Married    Spouse name: Not on file   Number of children: Not on file   Years of education: Not on file   Highest education level: Not on file  Occupational History   Not on file  Tobacco Use   Smoking status: Never   Smokeless tobacco: Never  Vaping Use   Vaping status: Never Used  Substance and Sexual Activity   Alcohol use: Yes    Comment: 2/3 a week   Drug use: No   Sexual activity: Not on file  Other Topics Concern   Not on file  Social History Narrative   Not on file   Social Drivers of Health   Financial Resource Strain: Not on file  Food Insecurity: No Food Insecurity (10/03/2023)   Hunger Vital Sign    Worried About Running Out of Food in the Last Year: Never true    Ran Out of Food in the Last Year: Never true  Transportation Needs: No  Transportation Needs (10/03/2023)   PRAPARE - Administrator, Civil Service (Medical): No    Lack of Transportation (Non-Medical): No  Physical Activity: Not on file  Stress: Not on file  Social Connections: Not on file  Intimate Partner Violence: Not At Risk (10/03/2023)   Humiliation, Afraid, Rape, and Kick questionnaire    Fear of Current or Ex-Partner: No    Emotionally Abused: No    Physically Abused: No    Sexually Abused: No    FAMILY HISTORY: Family History  Problem Relation Age of Onset   Breast cancer Mother 33       lumpectomy, x2 re-excisions   Prostate cancer Father        dx late 56s; recurrence w/ mets in 16s   Breast cancer Paternal Aunt        dx >50; bil mast   Breast cancer Paternal Aunt        dx 52s; d. 20s   Breast cancer Maternal Grandmother        dx before age 26   Breast cancer Cousin        pat female cousin; dx <50   Colon cancer Neg Hx    Colon polyps Neg Hx  Esophageal cancer Neg Hx    Stomach cancer Neg Hx     ALLERGIES:  has no known allergies.  MEDICATIONS:  Current Outpatient Medications  Medication Sig Dispense Refill   levothyroxine (SYNTHROID) 100 MCG tablet Take 100 mcg by mouth daily before breakfast.     liothyronine (CYTOMEL) 5 MCG tablet Take 5 mcg by mouth daily.     Multiple Vitamin (MULTIVITAMIN) capsule Take 1 capsule by mouth daily.     No current facility-administered medications for this visit.    REVIEW OF SYSTEMS:   Constitutional: Denies fevers, chills or abnormal night sweats Eyes: Denies blurriness of vision, double vision or watery eyes Ears, nose, mouth, throat, and face: Denies mucositis or sore throat Respiratory: Denies cough, dyspnea or wheezes Cardiovascular: Denies palpitation, chest discomfort or lower extremity swelling Gastrointestinal:  Denies nausea, heartburn or change in bowel habits Skin: Denies abnormal skin rashes Lymphatics: Denies new lymphadenopathy or easy  bruising Neurological:Denies numbness, tingling or new weaknesses Behavioral/Psych: Mood is stable, no new changes  Breast: Denies any palpable lumps or discharge All other systems were reviewed with the patient and are negative.  PHYSICAL EXAMINATION: ECOG PERFORMANCE STATUS: 0 - Asymptomatic  There were no vitals filed for this visit.   There were no vitals filed for this visit.    GENERAL:alert, no distress and comfortable  LABORATORY DATA:  I have reviewed the data as listed Lab Results  Component Value Date   WBC 6.0 10/03/2023   HGB 15.8 (H) 10/03/2023   HCT 46.6 (H) 10/03/2023   MCV 88.8 10/03/2023   PLT 311 10/03/2023   Lab Results  Component Value Date   NA 138 10/03/2023   K 3.9 10/03/2023   CL 103 10/03/2023   CO2 28 10/03/2023    RADIOGRAPHIC STUDIES: I have personally reviewed the radiological reports and agreed with the findings in the report.  ASSESSMENT AND PLAN:  Malignant neoplasm of upper-inner quadrant of right breast in female, estrogen receptor positive (HCC)  Assessment & Plan Invasive Lobular Carcinoma of the Right Breast Post bilateral mastectomy with reconstruction. 1 cm carcinoma with clear margins, no lymph node involvement. Oncotype score 18, no chemotherapy benefit. Recurrence risk 5% with antiestrogen therapy, 8-9% without. Radiation not required. Antiestrogen therapy necessary due to lobular histology. -Last labs with estradiol of 61, concern for pre menopausal status, LH and FSH within post menopausal range. -At this time, I have discussed tamoxifen being the choice of anti estrogen therapy -She has some uterine polyps and has surgery scheduled mid May with Dr Elon Spanner. -Per discussion with Dr Elon Spanner, better to wait till surgery to rule out EIN and then consider tamoxifen. -I think this is reasonable, we will schedule her to see me end of May and will initiate anti estrogen therapy at that time.   I connected with  [name] on  12/14/23 by a telephone application and verified that I am speaking with the correct person using two identifiers.   I discussed the limitations of evaluation and management by telemedicine. The patient expressed understanding and agreed to proceed.   Time spent: 20 minutes   Location of provider: office Location of pt: Home  All questions were answered. The patient knows to call the clinic with any problems, questions or concerns.    Rachel Moulds, MD 12/14/23

## 2023-12-14 NOTE — Assessment & Plan Note (Signed)
  Assessment & Plan Invasive Lobular Carcinoma of the Right Breast Post bilateral mastectomy with reconstruction. 1 cm carcinoma with clear margins, no lymph node involvement. Oncotype score 18, no chemotherapy benefit. Recurrence risk 5% with antiestrogen therapy, 8-9% without. Radiation not required. Antiestrogen therapy necessary due to lobular histology. -Last labs with estradiol of 61, concern for pre menopausal status, LH and FSH within post menopausal range. -At this time, I have discussed tamoxifen being the choice of anti estrogen therapy -She has some uterine polyps and has surgery scheduled mid May with Dr Elon Spanner. -Per discussion with Dr Elon Spanner, better to wait till surgery to rule out EIN and then consider tamoxifen. -I think this is reasonable, we will schedule her to see me end of May and will initiate anti estrogen therapy at that time.

## 2023-12-16 NOTE — Therapy (Unsigned)
 OUTPATIENT PHYSICAL THERAPY BREAST CANCER POST OP FOLLOW UP   Patient Name: Colleen Golden MRN: [id number] DOB:02/24/1966, 58 y.o., female Today's Date: 12/16/2023  END OF SESSION:   Past Medical History:  Diagnosis Date   Arthritis    Cancer (HCC)    Breast   Pre-diabetes    Thyroid disease    Past Surgical History:  Procedure Laterality Date   BREAST BIOPSY Right 09/21/2023   US  RT BREAST BX W LOC DEV 1ST LESION IMG BX SPEC US  GUIDE 09/21/2023 [facility] MAMMOGRAPHY   BREAST RECONSTRUCTION WITH PLACEMENT OF TISSUE EXPANDER AND ALLODERM Bilateral 11/13/2023   Procedure: BREAST RECONSTRUCTION WITH PLACEMENT OF TISSUE EXPANDER AND ALLODERM;  Surgeon: Alger Infield, MD;  Location: [facility];  Service: Plastics;  Laterality: Bilateral;   DILATION AND CURETTAGE OF UTERUS     NIPPLE SPARING MASTECTOMY Left 11/13/2023   Procedure: LEFT NIPPLE SPARING MASTECTOMY;  Surgeon: Lockie Rima, MD;  Location: [facility];  Service: General;  Laterality: Left;   NIPPLE SPARING MASTECTOMY WITH SENTINEL LYMPH NODE BIOPSY Right 11/13/2023   Procedure: RIGHT NIPPLE SPARING MASTECTOMY WITH RIGHT SENTINEL LYMPH NODE BIOPSY;  Surgeon: Lockie Rima, MD;  Location: [facility];  Service: General;  Laterality: Right;   THYROIDECTOMY N/A 05/29/2013   Procedure: TOTAL THYROIDECTOMY;  Surgeon: Keitha Pata, MD;  Location: [facility];  Service: General;  Laterality: N/A;   TONSILLECTOMY     TOTAL HIP ARTHROPLASTY Right 2010   Patient Active Problem List   Diagnosis Date Noted   Breast cancer, left breast (HCC) 11/13/2023   Genetic testing 10/12/2023   Malignant neoplasm of upper-inner quadrant of right breast in female, estrogen receptor positive (HCC) 10/01/2023   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 Lockie Rima  REFERRING DIAG: Right breast cancer  THERAPY DIAG:  Malignant neoplasm of  upper-inner quadrant of right breast in female, estrogen receptor positive (HCC)  Abnormal posture  Aftercare following surgery for neoplasm  Rationale for Evaluation and Treatment: Rehabilitation  ONSET DATE: 11/13/2023  SUBJECTIVE:                                                                                                                                                                                           SUBJECTIVE STATEMENT: Patient reports she underwent a bilateral mastectomy and right sentinel node biopsy (0/5 nodes positive on right and 0/1 positive on left) on 11/13/2023. Tissue expanders were placed for reconstruction. She is scheduled to have surgery to remove uterine polyps in May and then will meet with Dr. Arno Bibles to plan  for anti-estrogen therapy.  PERTINENT HISTORY:  Patient was diagnosed on 09/21/2023 with right grade 2 invasive lobular carcinoma. She underwent a bilateral mastectomy and right sentinel node biopsy (0/5 nodes positive on right and 0/1 positive on left) on 11/13/2023. It is ER/PR positive and HER2 negative with a Ki67 of 5%.   PATIENT GOALS:  Reassess how my recovery is going related to arm function, pain, and swelling.  PAIN:  Are you having pain? {OPRCPAIN:27236}  PRECAUTIONS: Recent Surgery, bilateral UE Lymphedema risk, Other: bilateral breast expanders in place  RED FLAGS: None   ACTIVITY LEVEL / LEISURE: She walks 4 miles per day (about an hour)    OBJECTIVE:   PATIENT SURVEYS:  QUICK DASH: ***  OBSERVATIONS: ***  POSTURE:  ***  LYMPHEDEMA ASSESSMENT:   UPPER EXTREMITY AROM/PROM:   A/PROM RIGHT   eval   RIGHT 12/17/2023  Shoulder extension 46   Shoulder flexion 165   Shoulder abduction 178   Shoulder internal rotation 59   Shoulder external rotation 87                           (Blank rows = not tested)   A/PROM LEFT   eval LEFT 12/17/2023  Shoulder extension 53   Shoulder flexion 155   Shoulder abduction 173    Shoulder internal rotation 73   Shoulder external rotation 90                           (Blank rows = not tested) LYMPHEDEMA ASSESSMENTS (in cm):    LANDMARK RIGHT   eval RIGHT 12/17/2023  10 cm proximal to olecranon process 21.8   Olecranon process 20   10 cm proximal to ulnar styloid process 18   Just proximal to ulnar styloid process 12.7   Across hand at thumb web space 16.3   At base of 2nd digit 5.4   (Blank rows = not tested)   LANDMARK LEFT   eval LEFT 12/17/2023  10 cm proximal to olecranon process 22.6   Olecranon process 20   10 cm proximal to ulnar styloid process 18.7   Just proximal to ulnar styloid process 13.3   Across hand at thumb web space 17.1   At base of 2nd digit 5.3   (Blank rows = not tested)   Surgery type/Date: Bilateral mastectomy with right sentinel node biopsy and reconstruction Number of lymph nodes removed: 5 on right; 1 on left Current/past treatment (chemo, radiation, hormone therapy): none Other symptoms:  Heaviness/tightness {yes/no:20286} Pain {yes/no:20286} Pitting edema {yes/no:20286} Infections {yes/no:20286} Decreased scar mobility {yes/no:20286} Stemmer sign {yes/no:20286}  PATIENT EDUCATION:  Education details: *** Person educated: {Person educated:25204} Education method: {Education Method:25205} Education comprehension: {Education Comprehension:25206}  HOME EXERCISE PROGRAM: Reviewed previously given post op HEP. ***  ASSESSMENT:  CLINICAL IMPRESSION: ***  Pt will benefit from skilled therapeutic intervention to improve on the following deficits: Decreased knowledge of precautions, impaired UE functional use, pain, decreased ROM, postural dysfunction.   PT treatment/interventions: ADL/Self care home management, {rehab planned interventions:25118::"97110-Therapeutic exercises","97530- Therapeutic [id number]- Neuromuscular re-education","97535- Self JXBJ","47829- Manual therapy"}   GOALS: Goals reviewed with  patient? Yes  LONG TERM GOALS:  (STG=LTG)  GOALS Name Target Date  Goal status  1 Pt will demonstrate she has regained full shoulder ROM and function post operatively compared to baselines.  Baseline: 11/28/2023 INITIAL  2  *** INITIAL  3  *** INITIAL  4  ***  INITIAL     PLAN:  PT FREQUENCY/DURATION: ***  PLAN FOR NEXT SESSION: ***   [facility]  [address], [address]  [place] [address]  [phone]  After Breast Cancer Class Video It is recommended you view the ABC class video to be educated on lymphedema risk reduction. This video lasts for about 30 minutes. It can be viewed on our website here: [email or web address]  Scar massage You can begin gentle scar massage to you incision sites. Gently place one hand on the incision and move the skin (without sliding on the skin) in various directions. Do this for a few minutes and then you can gently massage either coconut oil or vitamin E cream into the scars.  Compression garment You should continue wearing your compression bra until you feel like you no longer have swelling.  Home exercise Program Continue doing the exercises you were given until you feel like you can do them without feeling any tightness at the end.   Walking Program Studies show that 30 minutes of walking per day (fast enough to elevate your heart rate) can significantly reduce the risk of a cancer recurrence. If you can't walk due to other medical reasons, we encourage you to find another activity you could do (like a stationary bike or water exercise).  Posture After breast cancer surgery, people frequently sit with rounded shoulders posture because it puts their incisions on slack and feels better. If you sit like this and scar tissue forms in that position, you can become very tight and have pain sitting or standing with good posture. Try to be aware of your  posture and sit and stand up tall to heal properly.  Follow up PT: It is recommended you return every 3 months for the first 3 years following surgery to be assessed on the SOZO machine for an L-Dex score. This helps prevent clinically significant lymphedema in 95% of patients. These follow up screens are 10 minute appointments that you are not billed for.  Thoams Siefert,MARTI COOPER, PT 12/16/2023, 4:50 PM

## 2023-12-17 ENCOUNTER — Telehealth: Payer: Self-pay | Admitting: Hematology and Oncology

## 2023-12-17 ENCOUNTER — Ambulatory Visit: Attending: General Surgery | Admitting: Physical Therapy

## 2023-12-17 ENCOUNTER — Encounter: Payer: Self-pay | Admitting: *Deleted

## 2023-12-17 DIAGNOSIS — Z483 Aftercare following surgery for neoplasm: Secondary | ICD-10-CM | POA: Insufficient documentation

## 2023-12-17 DIAGNOSIS — Z170 Estrogen receptor positive status [ER+]: Secondary | ICD-10-CM

## 2023-12-17 DIAGNOSIS — R293 Abnormal posture: Secondary | ICD-10-CM

## 2023-12-17 NOTE — Telephone Encounter (Signed)
 Left patient a vm regarding upcoming appointment

## 2023-12-17 NOTE — Therapy (Signed)
 OUTPATIENT PHYSICAL THERAPY BREAST CANCER BASELINE EVALUATION   Patient Name: Colleen Golden MRN: [id number] DOB:06/01/1966, 58 y.o., female Today's Date: 12/17/2023  END OF SESSION:  PT End of Session - 12/17/23 1210     Visit Number 2    Number of Visits 2    PT Start Time 1115    PT Stop Time 1145    PT Time Calculation (min) 30 min    Activity Tolerance Patient tolerated treatment well              Past Medical History:  Diagnosis Date   Arthritis    Cancer (HCC)    Breast   Pre-diabetes    Thyroid disease    Past Surgical History:  Procedure Laterality Date   BREAST BIOPSY Right 09/21/2023   [place] RT BREAST BX W LOC DEV 1ST LESION IMG BX SPEC US GUIDE 09/21/2023 [facility] MAMMOGRAPHY   BREAST RECONSTRUCTION WITH PLACEMENT OF TISSUE EXPANDER AND ALLODERM Bilateral 11/13/2023   Procedure: BREAST RECONSTRUCTION WITH PLACEMENT OF TISSUE EXPANDER AND ALLODERM;  Surgeon: Glenna Fellows, MD;  Location: [facility];  Service: Plastics;  Laterality: Bilateral;   DILATION AND CURETTAGE OF UTERUS     NIPPLE SPARING MASTECTOMY Left 11/13/2023   Procedure: LEFT NIPPLE SPARING MASTECTOMY;  Surgeon: Almond Lint, MD;  Location: [facility];  Service: General;  Laterality: Left;   NIPPLE SPARING MASTECTOMY WITH SENTINEL LYMPH NODE BIOPSY Right 11/13/2023   Procedure: RIGHT NIPPLE SPARING MASTECTOMY WITH RIGHT SENTINEL LYMPH NODE BIOPSY;  Surgeon: Almond Lint, MD;  Location:  [facility];  Service: General;  Laterality: Right;   THYROIDECTOMY N/A 05/29/2013   Procedure: TOTAL THYROIDECTOMY;  Surgeon: Velora Heckler, MD;  Location: [facility];  Service: General;  Laterality: N/A;   TONSILLECTOMY     TOTAL HIP ARTHROPLASTY Right 2010   Patient Active Problem List   Diagnosis Date Noted   Breast cancer, left breast (HCC) 11/13/2023   Genetic testing 10/12/2023   Malignant neoplasm of upper-inner quadrant of right breast in female, estrogen  receptor positive (HCC) 10/01/2023   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 Almond Lint  REFERRING DIAG: Right breast cancer  THERAPY DIAG:  Aftercare following surgery for neoplasm  Rationale for Evaluation and Treatment: Rehabilitation  ONSET DATE: 09/21/2023  SUBJECTIVE:                                                                                                                                                                                           SUBJECTIVE STATEMENT: Pt is here for her post op  visit.  She says she is doing well but "just can't feel" anything especially in right axillary area. She has numbness on both sides of her lateral chest and axillary area.  She says she is back to walking 4 miles a day and doing light housework. She plans to have more surgery in June to remove expanders and place implants. PERTINENT HISTORY:  Patient was diagnosed on 09/21/2023 with right grade 2 invasive lobular carcinoma. It measures 6 mm and is located in the upper inner quadrant. It is ER/PR positive and HER2 negative with a Ki67 of 5%. On 11/13/2023 pt had bilateral nipple sparing mastectomy with placement of expanders.  0/5 lymph nodes removed from right axilla , 0/1 on left . She plans to have hormonal therapy but no radiation.   PATIENT GOALS:   reduce lymphedema risk and learn post op HEP.   PAIN:  Are you having pain? No  PRECAUTIONS: at risk for lymphedema due to lymph node removal   RED FLAGS: None   HAND DOMINANCE: right  WEIGHT BEARING RESTRICTIONS: No  FALLS:  Has patient fallen in last 6 months? No  LIVING ENVIRONMENT: Patient lives with: her husband and 36 y.o. daughter Lives in: House/apartment Has following equipment at home: None  OCCUPATION: Unemployed  LEISURE: She walks 4 miles per day (about an hour)  PRIOR LEVEL OF FUNCTION: Independent   OBJECTIVE:  COGNITION: Overall cognitive  status: Within functional limits for tasks assessed    POSTURE:  Forward head and rounded shoulders posture  UPPER EXTREMITY AROM/PROM:  A/PROM RIGHT   eval  RIGHT 12/17/2023  Shoulder extension 46   Shoulder flexion 165 180  Shoulder abduction 178 180  Shoulder internal rotation 59   Shoulder external rotation 87 90    (Blank rows = not tested)  A/PROM LEFT   eval LEFT 12/17/2023  Shoulder extension 53   Shoulder flexion 155 174  Shoulder abduction 173 175  Shoulder internal rotation 73   Shoulder external rotation 90 90    (Blank rows = not tested)  CERVICAL AROM: All within normal limits  UPPER EXTREMITY STRENGTH: WFL  LYMPHEDEMA ASSESSMENTS (in cm):   LANDMARK RIGHT   eval RIGHT 12/17/2023  10 cm proximal to olecranon process 21.8 22  Olecranon process 20 20  10  cm proximal to ulnar styloid process 18 17.5  Just proximal to ulnar styloid process 12.7 13.5  Across hand at thumb web space 16.3 17.5  At base of 2nd digit 5.4 5.1  (Blank rows = not tested)  LANDMARK LEFT   eval LEFT 12/17/2023  10 cm proximal to olecranon process 22.6 22.5  Olecranon process 20 20  10  cm proximal to ulnar styloid process 18.7 19  Just proximal to ulnar styloid process 13.3 13.3  Across hand at thumb web space 17.1 17.5  At base of 2nd digit 5.3 5.1  (Blank rows = not tested)  L-DEX LYMPHEDEMA SCREENING:  The patient was assessed using the L-Dex machine today to produce a lymphedema index baseline score. The patient will be reassessed on a regular basis (typically every 3 months) to obtain new L-Dex scores. If the score is > 6.5 points away from his/her baseline score indicating onset of subclinical lymphedema, it will be recommended to wear a compression garment for 4 weeks, 12 hours per day and then be reassessed. If the score continues to be > 6.5 points from baseline at reassessment, we will initiate lymphedema treatment. Assessing in this manner has a 95% rate  of preventing  clinically significant lymphedema.     QUICK DASH SURVEY: 27.27  Cindia Crease - 12/17/23 0001     Open a tight or new jar Mild difficulty (P)     Do heavy household chores (wash walls, wash floors) Mild difficulty (P)     Carry a shopping bag or briefcase No difficulty (P)     Wash your back Mild difficulty (P)     Use a knife to cut food No difficulty (P)     Recreational activities in which you take some force or impact through your arm, shoulder, or hand (golf, hammering, tennis) Moderate difficulty (P)     During the past week, to what extent has your arm, shoulder or hand problem interfered with your normal social activities with family, friends, neighbors, or groups? Slightly (P)     During the past week, to what extent has your arm, shoulder or hand problem limited your work or other regular daily activities Slightly (P)     Arm, shoulder, or hand pain. Moderate (P)     Tingling (pins and needles) in your arm, shoulder, or hand Moderate (P)     Difficulty Sleeping Mild difficulty (P)     DASH Score 27.27 % (P)                PATIENT EDUCATION:  Education details: Lymphedema risk reduction and post op shoulder/posture HEP, Gave pt information about ABC class Person educated: Patient Education method: Explanation, Demonstration, Handout Education comprehension: Patient verbalized understanding and returned demonstration  HOME EXERCISE PROGRAM: Reviewed  home exercise program today for post op shoulder range of motion. These included active assist shoulder flexion in sitting, scapular retraction, wall walking with shoulder abduction, and hands behind head external rotation.  She was encouraged to do these twice a day, holding 3 seconds and repeating 5 times when permitted by her physician. Also instructed in standing dowel flexion, abduction and extension that pt was able demonstrate.    ASSESSMENT:  CLINICAL IMPRESSION: Patient is doing very well post operatively after  bilateral mastectomy with expander placement. She his healing well and has returned to baseline ROM. She is familiar with range of motion exercises and will continue to use her arms for household activities.  She may be interested in progressing to strengthening once she has recovered from her next surgery She will benefit from L-Dex screens every 3 months for 2 years to detect subclinical lymphedema.  Pt will benefit from skilled therapeutic intervention to improve on the following deficits: Decreased knowledge of precautions, impaired UE functional use, pain, decreased ROM, postural dysfunction.   PT treatment/interventions: ADL/self-care home management, pt/family education, therapeutic exercise  REHAB POTENTIAL: Excellent  CLINICAL DECISION MAKING: Stable/uncomplicated  EVALUATION COMPLEXITY: Low   GOALS: Goals reviewed with patient? YES  LONG TERM GOALS: (STG=LTG)    Name Target Date Goal status  1 Pt will be able to verbalize understanding of pertinent lymphedema risk reduction practices relevant to her dx specifically related to skin care.  Baseline:  No knowledge 12/17/2023 Achieved at eval  2 Pt will be able to return demo and/or verbalize understanding of the post op HEP related to regaining shoulder ROM. Baseline:  No knowledge 12/17/2023 Achieved at eval  3 Pt will be able to verbalize understanding of the importance of attending the post op After Breast CA Class for further lymphedema risk reduction education and therapeutic exercise.  Baseline:  No knowledge 12/17/2023 Achieved at eval  4 Pt will demo  she has regained full shoulder ROM and function post operatively compared to baselines.  Baseline: See objective measurements taken today. 11/28/2023 Achieved 12/17/2023    PLAN:  PT FREQUENCY/DURATION: EVAL and 1 follow up appointment.   PLAN FOR NEXT SESSION: No further skilled PT is needed . Pt will follow up with SOZO screening every 3 months.     The patient was  educated today on appropriate basic range of motion exercises to begin post operatively and the importance of attending the After Breast Cancer class following surgery.  Patient was educated today on lymphedema risk reduction practices as it pertains to recommendations that will benefit the patient immediately following surgery.  She verbalized good understanding.    Physical Therapy Information for After Breast Cancer Surgery/Treatment:  Lymphedema is a swelling condition that you may be at risk for in your arm if you have lymph nodes removed from the armpit area.  After a sentinel node biopsy, the risk is approximately 5-9% and is higher after an axillary node dissection.  There is treatment available for this condition and it is not life-threatening.  Contact your physician or physical therapist with concerns. You may begin the 4 shoulder/posture exercises (see additional sheet) when permitted by your physician (typically a week after surgery).  If you have drains, you may need to wait until those are removed before beginning range of motion exercises.  A general recommendation is to not lift your arms above shoulder height until drains are removed.  These exercises should be done to your tolerance and gently.  This is not a "no pain/no gain" type of recovery so listen to your body and stretch into the range of motion that you can tolerate, stopping if you have pain.  If you are having immediate reconstruction, ask your plastic surgeon about doing exercises as he or she may want you to wait. We encourage you to attend the free one time ABC (After Breast Cancer) class offered by [facility].  You will learn information related to lymphedema risk, prevention and treatment and additional exercises to regain mobility following surgery.  You can call [phone] for more information.  This is offered the 1st and 3rd Monday of each month.  You only attend the class one time. While  undergoing any medical procedure or treatment, try to avoid blood pressure being taken or needle sticks from occurring on the arm on the side of cancer.   This recommendation begins after surgery and continues for the rest of your life.  This may help reduce your risk of getting lymphedema (swelling in your arm). An excellent resource for those seeking information on lymphedema is the National Lymphedema Network's web site. It can be accessed at www.lymphnet.org If you notice swelling in your hand, arm or breast at any time following surgery (even if it is many years from now), please contact your doctor or physical therapist to discuss this.  Lymphedema can be treated at any time but it is easier for you if it is treated early on.  If you feel like your shoulder motion is not returning to normal in a reasonable amount of time, please contact your surgeon or physical therapist.  [facility] [phone]. [address], [place] [address]  ABC CLASS After Breast Cancer Class  After Breast Cancer Class is a specially designed exercise class to assist you in a safe recover after having breast cancer surgery.  In this class you will  learn how to get back to full function whether your drains were just removed or if you had surgery a month ago.  This one-time class is held the 1st and 3rd Monday of every month from 11:00 a.m. until 12:00 noon virtually.  This class is FREE and space is limited. For more information or to register for the next available class, call [phone].  Class Goals  Understand specific stretches to improve the flexibility of you chest and shoulder. Learn ways to safely strengthen your upper body and improve your posture. Understand the warning signs of infection and why you may be at risk for an arm infection. Learn about Lymphedema and prevention.  ** You do not attend this class until after surgery.  Drains must be removed to  participate  Patient was instructed today in a home exercise program today for post op shoulder range of motion. These included active assist shoulder flexion in sitting, scapular retraction, wall walking with shoulder abduction, and hands behind head external rotation.  She was encouraged to do these twice a day, holding 3 seconds and repeating 5 times when permitted by her physician.  Alfonse Iha, PT 12/17/23 12:14 PM    [facility] [address] [place], [place], [address] Phone: [phone]   Fax:  [phone]  Patient Details  Name: Anjalina Bergevin MRN: [id number] Date of Birth: 03/24/1966 Referring Provider:  Lockie Rima, MD  Encounter Date: 12/17/2023   Molinda Angelica, PT 12/17/2023, 12:11 PM  [facility] [address] [place], [place], [address] Phone: [phone]   Fax:  [phone]

## 2024-01-23 NOTE — H&P (Addendum)
 Subjective Patient ID: Colleen Golden is a 58 y.o. female.     HPI   2.5 months post op bilateral mastectomies with immediate reconstruction. Scheduled for implant exchange later this month.   Presented following high risk screening MRI demonstrating right upper inner breast mass 0.6 cm and a 0.8 cm mass right LOQ. Follow up US  showed mass in the right breast at 2 o'clock 5 cmfn 6 x 5 x 6 mm. No sonographic correlate right LOQ mass. 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 Left breast biopsy benign.    Patient elected for bilateral mastectomies. Final pathology right breast 1 cm ILC with LCIS margins negative, ER 50% positive, weak staining/PR 100%+, Her2-, 0/5 SLN   Oncotype 18.   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 Mother MGM PA and cousin with breast ca.   Prior B cup over right and B/C over left. Right mastectomy 262 g left mastectomy 340 g    Lives with spouse and adult daughter. Another daughter in school in [place], one child in [place]. Retired from Social research officer, government.    Review of Systems   Objective Physical Exam  Cardiovascular: Normal rate, regular rhythm and normal heart sounds.    Pulmonary/Chest Effort normal and breath sounds normal.      Chest wall asymmetry present with right side depressed/left more prominent   Chest: scars maturing dried epidermolysis left NAC still in place Bilateral chest expanded SN to nipple R 20 L 20 cm Nipple to IMF R 7 L 7 cm     Assessment/Plan Malignant neoplasm of upper-inner quadrant of right breast in female, estrogen receptor positive  S/p bilateral NSM, right SLN, bilateral prepectoral TE/ADM (Alloderm) reconstruction   Pictures today. F/u with Dr. Arno Bibles scheduled 5.28.25-may initiate tamoxifen at that time. Patient instructed not to start until after surgery.  Reviewed saline  vs silicone, shaped vs round. As in prepectoral position recommend silicone implants , HCG or capacity filled implants.HCG or capacity filled silicone implants may offer reduced risk visible rippling.  Reviewed MRI or US  surveillance for rupture with silicone implants. Reviewed examples for 4th generation, capacity filled 4th generation, and HCG implants vs saline implants. Reviewed risks AP flipping that may be more noticeable with 5th generation implants, may require surgery to correct. Reviewed textured vs smooth, former associated with ALCL risk. Reviewed size in part guided by width chest, cannot assure her cup size. Patient has elected for silicone, plan smooth round.Completed Natrelle physician patient checklist.   Reviewed possible lipofilling. Reviewed purpose of this to aid with contour limit visible rippling. Reviewed donor site pain need for compression variable take of graft fat necrosis that presents as masses. She has minimal donor site for this so will defer.   Additional risks including but not limited to bleeding, hematoma, seroma, infection, damage to adjacent structures, need for additional procedures, unacceptable cosmetic result, blood clots in legs of lungs reviewed.    Rx for Bactrim and Robaxin  given. Patient has oxycodone  at home.      Natrelle [id number] 300 ml tissue expanders placed bilateral,  fill volume 240 ml saline bilateral.    Alger Infield, MD [facility]  Office/ physician access line after hours [phone]

## 2024-01-24 ENCOUNTER — Encounter (HOSPITAL_BASED_OUTPATIENT_CLINIC_OR_DEPARTMENT_OTHER): Payer: Self-pay | Admitting: Plastic Surgery

## 2024-01-24 ENCOUNTER — Other Ambulatory Visit: Payer: Self-pay

## 2024-01-24 NOTE — Progress Notes (Signed)
   01/24/24 1304  PAT Phone Screen  Is the patient taking a GLP-1 receptor agonist? No  Do You Have Diabetes? (S)  No (predm)  Do You Have Hypertension? No  Have You Ever Been to the ER for Asthma? No  Have You Taken Oral Steroids in the Past 3 Months? No  Do you Take Phenteramine or any Other Diet Drugs? No  Recent  Lab Work, EKG, CXR? No  Do you have a history of heart problems? No  Any Recent Hospitalizations? No  Height 5\' 4"  (1.626 m)  Weight 47.6 kg  Pat Appointment Scheduled (S)  Yes ([facility])

## 2024-01-29 ENCOUNTER — Telehealth: Payer: Self-pay

## 2024-01-29 NOTE — Telephone Encounter (Signed)
 Left message to remind about appt for 5/28

## 2024-01-30 ENCOUNTER — Inpatient Hospital Stay: Attending: Hematology and Oncology | Admitting: Hematology and Oncology

## 2024-01-30 VITALS — BP 121/69 | HR 63 | Temp 98.3°F | Resp 17 | Wt 104.5 lb

## 2024-01-30 DIAGNOSIS — Z170 Estrogen receptor positive status [ER+]: Secondary | ICD-10-CM | POA: Diagnosis not present

## 2024-01-30 DIAGNOSIS — C50211 Malignant neoplasm of upper-inner quadrant of right female breast: Secondary | ICD-10-CM | POA: Insufficient documentation

## 2024-01-30 DIAGNOSIS — Z1721 Progesterone receptor positive status: Secondary | ICD-10-CM | POA: Diagnosis not present

## 2024-01-30 DIAGNOSIS — Z79810 Long term (current) use of selective estrogen receptor modulators (SERMs): Secondary | ICD-10-CM | POA: Insufficient documentation

## 2024-01-30 DIAGNOSIS — Z1732 Human epidermal growth factor receptor 2 negative status: Secondary | ICD-10-CM | POA: Diagnosis not present

## 2024-01-30 MED ORDER — TAMOXIFEN CITRATE 20 MG PO TABS: 20.0000 mg | ORAL_TABLET | Freq: Every day | ORAL | 3 refills | Status: AC

## 2024-01-30 NOTE — Assessment & Plan Note (Addendum)
  Assessment & Plan Invasive Lobular Carcinoma of the Right Breast Post bilateral mastectomy with reconstruction. 1 cm carcinoma with clear margins, no lymph node involvement. Oncotype score 18, no chemotherapy benefit. Recurrence risk 5% with antiestrogen therapy, 8-9% without. Radiation not required. Antiestrogen therapy necessary due to lobular histology.  Assessment and Plan Assessment & Plan Invasive Lobular Carcinoma of the Right Breast Post bilateral mastectomy with reconstruction. 1 cm carcinoma with clear margins, no lymph node involvement. Oncotype score 18, no chemotherapy benefit.. Radiation not required. Antiestrogen therapy necessary due to estrogen-driven nature. She had uterine polyps which were scheduled to be removed, hence tamoxifen held. Since polyp is deemed benign and no proliferative changes, she is agreeable to starting tamoxifen With regards to Tamoxifen, we discussed that this is a SERM, selective estrogen receptor modulator. We discussed mechanism of action of Tamoxifen, adverse effects on Tamoxifen including but not limited to post menopausal symptoms, increased risk of DVT/PE, increased risk of endometrial cancer, questionable cataracts with long term use and increased risk of cardiovascular events in the study which was not statistically significant. A benefit from Tamoxifen would be improvement in bone density. Tamoxifen dispensed, she plans to start in 2 weeks. SCP visit in approximately 3.5 months. She was encouraged to call with any questions or concerns.

## 2024-01-30 NOTE — Progress Notes (Signed)
 [facility] CONSULT NOTE  Patient Care Team: Belle Box, MD as PCP - General (Obstetrics and Gynecology) Auther Bo, RN as Oncology Nurse Navigator Alane Hsu, RN as Oncology Nurse Navigator Murleen Arms, MD as Consulting Physician (Hematology and Oncology)  CHIEF COMPLAINTS/PURPOSE OF CONSULTATION:  Newly diagnosed breast cancer  HISTORY OF PRESENTING ILLNESS:  Colleen Golden 58 y.o. female is here because of recent diagnosis of right breast cancer  I reviewed her records extensively and collaborated the history with the patient.  SUMMARY OF ONCOLOGIC HISTORY: Oncology History  Malignant neoplasm of upper-inner quadrant of right breast in female, estrogen receptor positive (HCC)  09/18/2023 Breast MRI   New 0.6 cm far posterior UPPER INNER RIGHT breast mass and new 0.8 cm LOWER OUTER RIGHT breast mass. 2nd-look ultrasound with possible biopsies (if visualized sonographically) recommended. New 0.6 cm linear non masslike enhancement within the anterior UPPER LEFT breast. MR guided biopsy recommended. No abnormal appearing lymph nodes.     09/21/2023 Pathology Results   Biopsy from the right breast 2:00 showed grade 2 invasive lobular carcinoma, focal LCIS, estrogen 50% positive weak staining intensity, progesterone receptor 100% positive strong staining intensity, HER2 1+ Ki-67 of 5%   10/01/2023 Initial Diagnosis   Malignant neoplasm of upper-inner quadrant of right breast in female, estrogen receptor positive (HCC)   10/03/2023 Cancer Staging   Staging form: Breast, AJCC 8th Edition - Clinical stage from 10/03/2023: Stage IA (cT1b, cN0, cM0, G2, ER+, PR+, HER2-) - Signed by Murleen Arms, MD on 10/03/2023 Stage prefix: Initial diagnosis Histologic grading system: 3 grade system Laterality: Right Staged by: Pathologist and managing physician Stage used in treatment planning: Yes National guidelines used in treatment planning: Yes Type of  national guideline used in treatment planning: NCCN   10/11/2023 Genetic Testing   Negative [facility] CancerNext-Expanded +RNAinsight Panel.  VUS in POLE at c.6531+4C>T. Report date is 10/11/2023.   The CancerNext-Expanded gene panel offered by [facility] and includes sequencing, rearrangement, and RNA analysis for the following 76 genes: AIP, ALK, APC, ATM, AXIN2, BAP1, BARD1, BMPR1A, BRCA1, BRCA2, BRIP1, CDC73, CDH1, CDK4, CDKN1B, CDKN2A, CEBPA, CHEK2, CTNNA1, DDX41, DICER1, ETV6, FH, FLCN, GATA2, LZTR1, MAX, MBD4, MEN1, MET, MLH1, MSH2, MSH3, MSH6, MUTYH, NF1, NF2, NTHL1, PALB2, PHOX2B, PMS2, POT1, PRKAR1A, PTCH1, PTEN, RAD51C, RAD51D, RB1, RET, RUNX1, SDHA, SDHAF2, SDHB, SDHC, SDHD, SMAD4, SMARCA4, SMARCB1, SMARCE1, STK11, SUFU, TMEM127, TP53, TSC1, TSC2, VHL, and WT1 (sequencing and deletion/duplication); EGFR, HOXB13, KIT, MITF, PDGFRA, POLD1, and POLE (sequencing only); EPCAM and GREM1 (deletion/duplication only).    11/13/2023 Definitive Surgery   She had bilateral mastectomy which showed grade 2 ILC in the right breast measuring 1 cm LCIS, neg margins, ER positive, PR positive, Her 2 neg, Ki 67 5%. 5/5 LN without any evidence of malignancy.   11/13/2023 Oncotype testing   Oncotype Dx of 18, no added benefit of chemotherapy.    Discussed the use of AI scribe software for clinical note transcription with the patient, who gave verbal consent to proceed.  History of Present Illness  Colleen Golden is a 58 year old female who presents for telephone follow up.    Colleen Golden is a 58 year old female with lobular carcinoma who presents for follow-up after endometrial polyp removal.  She underwent endometrial polyp removal, and the pathology report confirmed the polyp was benign with no cancer or precancerous cells. The specimen showed a benign endometrial background with a weakly proliferative pattern. There was a delay  in receiving the full pathology report, which was eventually  accessed through [facility].  She is considering starting tamoxifen following the procedure. She feels like she probably has no hormones in her body. She plans to start tamoxifen after her upcoming reconstruction surgery, scheduled for Friday, and intends to wait a week post-surgery before initiating the medication. Her mother previously took tamoxifen for over five years without significant side effects, which provides her with reassurance. She takes thyroid  medication in the morning and plans to adjust the timing of tamoxifen intake to avoid interaction.  She had a baseline bone density test in 2021, which was normal.    Rest of the pertinent 10 point ROS reviewed and neg.  MEDICAL HISTORY:  Past Medical History:  Diagnosis Date   Arthritis    Cancer (HCC)    Breast   Pre-diabetes    Thyroid  disease     SURGICAL HISTORY: Past Surgical History:  Procedure Laterality Date   BREAST BIOPSY Right 09/21/2023   US  RT BREAST BX W LOC DEV 1ST LESION IMG BX SPEC US  GUIDE 09/21/2023 [facility] MAMMOGRAPHY   BREAST RECONSTRUCTION WITH PLACEMENT OF TISSUE EXPANDER AND ALLODERM Bilateral 11/13/2023   Procedure: BREAST RECONSTRUCTION WITH PLACEMENT OF TISSUE EXPANDER AND ALLODERM;  Surgeon: Alger Infield, MD;  Location: [facility];  Service: Plastics;  Laterality: Bilateral;   DILATION AND CURETTAGE OF UTERUS     NIPPLE SPARING MASTECTOMY Left 11/13/2023   Procedure: LEFT NIPPLE SPARING MASTECTOMY;  Surgeon: Lockie Rima, MD;  Location: [facility];  Service: General;  Laterality: Left;   NIPPLE SPARING MASTECTOMY WITH SENTINEL LYMPH NODE BIOPSY Right 11/13/2023   Procedure: RIGHT NIPPLE SPARING MASTECTOMY WITH RIGHT SENTINEL LYMPH NODE BIOPSY;  Surgeon: Lockie Rima, MD;  Location:  SURGERY CENTER;  Service: General;  Laterality: Right;   THYROIDECTOMY N/A 05/29/2013   Procedure: TOTAL THYROIDECTOMY;  Surgeon: Keitha Pata, MD;  Location: [facility];  Service:  General;  Laterality: N/A;   TONSILLECTOMY     TOTAL HIP ARTHROPLASTY Right 2010    SOCIAL HISTORY: Social History   Socioeconomic History   Marital status: Married    Spouse name: Not on file   Number of children: Not on file   Years of education: Not on file   Highest education level: Not on file  Occupational History   Not on file  Tobacco Use   Smoking status: Never   Smokeless tobacco: Never  Vaping Use   Vaping status: Never Used  Substance and Sexual Activity   Alcohol use: Yes    Comment: 2/3 a week   Drug use: No   Sexual activity: Not on file  Other Topics Concern   Not on file  Social History Narrative   Not on file   Social Drivers of Health   Financial Resource Strain: Not on file  Food Insecurity: No Food Insecurity (10/03/2023)   Hunger Vital Sign    Worried About Running Out of Food in the Last Year: Never true    Ran Out of Food in the Last Year: Never true  Transportation Needs: No Transportation Needs (10/03/2023)   PRAPARE - Administrator, Civil Service (Medical): No    Lack of Transportation (Non-Medical): No  Physical Activity: Not on file  Stress: Not on file  Social Connections: Not on file  Intimate Partner Violence: Not At Risk (10/03/2023)   Humiliation, Afraid, Rape, and Kick questionnaire    Fear of Current or Ex-Partner: No  Emotionally Abused: No    Physically Abused: No    Sexually Abused: No    FAMILY HISTORY: Family History  Problem Relation Age of Onset   Breast cancer Mother 11       lumpectomy, x2 re-excisions   Prostate cancer Father        dx late 40s; recurrence w/ mets in 74s   Breast cancer Paternal Aunt        dx >50; bil mast   Breast cancer Paternal Aunt        dx 34s; d. 58s   Breast cancer Maternal Grandmother        dx before age 44   Breast cancer Cousin        pat female cousin; dx <50   Colon cancer Neg Hx    Colon polyps Neg Hx    Esophageal cancer Neg Hx    Stomach cancer Neg Hx      ALLERGIES:  has no known allergies.  MEDICATIONS:  Current Outpatient Medications  Medication Sig Dispense Refill   tamoxifen (NOLVADEX) 20 MG tablet Take 1 tablet (20 mg total) by mouth daily. 90 tablet 3   levothyroxine  (SYNTHROID ) 100 MCG tablet Take 100 mcg by mouth daily before breakfast.     liothyronine  (CYTOMEL ) 5 MCG tablet Take 5 mcg by mouth daily.     Multiple Vitamin (MULTIVITAMIN) capsule Take 1 capsule by mouth daily.     No current facility-administered medications for this visit.    REVIEW OF SYSTEMS:   Constitutional: Denies fevers, chills or abnormal night sweats Eyes: Denies blurriness of vision, double vision or watery eyes Ears, nose, mouth, throat, and face: Denies mucositis or sore throat Respiratory: Denies cough, dyspnea or wheezes Cardiovascular: Denies palpitation, chest discomfort or lower extremity swelling Gastrointestinal:  Denies nausea, heartburn or change in bowel habits Skin: Denies abnormal skin rashes Lymphatics: Denies new lymphadenopathy or easy bruising Neurological:Denies numbness, tingling or new weaknesses Behavioral/Psych: Mood is stable, no new changes  Breast: Denies any palpable lumps or discharge All other systems were reviewed with the patient and are negative.  PHYSICAL EXAMINATION: ECOG PERFORMANCE STATUS: 0 - Asymptomatic  Vitals:   01/30/24 1340  BP: 121/69  Pulse: 63  Resp: 17  Temp: 98.3 F (36.8 C)  SpO2: 100%     Filed Weights   01/30/24 1340  Weight: 104 lb 8 oz (47.4 kg)      GENERAL:alert, no distress and comfortable  LABORATORY DATA:  I have reviewed the data as listed Lab Results  Component Value Date   WBC 6.0 10/03/2023   HGB 15.8 (H) 10/03/2023   HCT 46.6 (H) 10/03/2023   MCV 88.8 10/03/2023   PLT 311 10/03/2023   Lab Results  Component Value Date   NA 138 10/03/2023   K 3.9 10/03/2023   CL 103 10/03/2023   CO2 28 10/03/2023    RADIOGRAPHIC STUDIES: I have personally reviewed  the radiological reports and agreed with the findings in the report.  ASSESSMENT AND PLAN:  Malignant neoplasm of upper-inner quadrant of right breast in female, estrogen receptor positive (HCC)  Assessment & Plan Invasive Lobular Carcinoma of the Right Breast Post bilateral mastectomy with reconstruction. 1 cm carcinoma with clear margins, no lymph node involvement. Oncotype score 18, no chemotherapy benefit. Recurrence risk 5% with antiestrogen therapy, 8-9% without. Radiation not required. Antiestrogen therapy necessary due to lobular histology.  Assessment and Plan Assessment & Plan Invasive Lobular Carcinoma of the Right Breast Post bilateral mastectomy  with reconstruction. 1 cm carcinoma with clear margins, no lymph node involvement. Oncotype score 18, no chemotherapy benefit.. Radiation not required. Antiestrogen therapy necessary due to estrogen-driven nature. She had uterine polyps which were scheduled to be removed, hence tamoxifen  held. Since polyp is deemed benign and no proliferative changes, she is agreeable to starting tamoxifen  With regards to Tamoxifen , we discussed that this is a SERM, selective estrogen receptor modulator. We discussed mechanism of action of Tamoxifen , adverse effects on Tamoxifen  including but not limited to post menopausal symptoms, increased risk of DVT/PE, increased risk of endometrial cancer, questionable cataracts with long term use and increased risk of cardiovascular events in the study which was not statistically significant. A benefit from Tamoxifen  would be improvement in bone density. Tamoxifen  dispensed, she plans to start in 2 weeks. SCP visit in approximately 3.5 months. She was encouraged to call with any questions or concerns.     All questions were answered. The patient knows to call the clinic with any problems, questions or concerns.    Murleen Arms, MD 01/30/24

## 2024-01-31 MED ORDER — CHLORHEXIDINE GLUCONATE CLOTH 2 % EX PADS: 6.0000 | MEDICATED_PAD | Freq: Once | CUTANEOUS | Status: DC

## 2024-01-31 NOTE — Progress Notes (Signed)

## 2024-02-01 ENCOUNTER — Ambulatory Visit (HOSPITAL_BASED_OUTPATIENT_CLINIC_OR_DEPARTMENT_OTHER): Admitting: Anesthesiology

## 2024-02-01 ENCOUNTER — Other Ambulatory Visit: Payer: Self-pay

## 2024-02-01 ENCOUNTER — Encounter (HOSPITAL_BASED_OUTPATIENT_CLINIC_OR_DEPARTMENT_OTHER)
Admission: RE | Disposition: A | Discharge status: Home / Self Care | Payer: Self-pay | Source: Home / Self Care | Attending: Plastic Surgery

## 2024-02-01 ENCOUNTER — Ambulatory Visit (HOSPITAL_BASED_OUTPATIENT_CLINIC_OR_DEPARTMENT_OTHER)
Admission: RE | Admit: 2024-02-01 | Discharge: 2024-02-01 | Disposition: A | Discharge status: Home / Self Care | Attending: Plastic Surgery | Admitting: Plastic Surgery

## 2024-02-01 ENCOUNTER — Encounter (HOSPITAL_BASED_OUTPATIENT_CLINIC_OR_DEPARTMENT_OTHER): Payer: Self-pay | Admitting: Plastic Surgery

## 2024-02-01 DIAGNOSIS — Z9013 Acquired absence of bilateral breasts and nipples: Secondary | ICD-10-CM | POA: Insufficient documentation

## 2024-02-01 DIAGNOSIS — Z1721 Progesterone receptor positive status: Secondary | ICD-10-CM | POA: Diagnosis not present

## 2024-02-01 DIAGNOSIS — Z170 Estrogen receptor positive status [ER+]: Secondary | ICD-10-CM | POA: Insufficient documentation

## 2024-02-01 DIAGNOSIS — Z853 Personal history of malignant neoplasm of breast: Secondary | ICD-10-CM | POA: Insufficient documentation

## 2024-02-01 DIAGNOSIS — Z421 Encounter for breast reconstruction following mastectomy: Secondary | ICD-10-CM | POA: Diagnosis present

## 2024-02-01 DIAGNOSIS — Z1732 Human epidermal growth factor receptor 2 negative status: Secondary | ICD-10-CM | POA: Insufficient documentation

## 2024-02-01 HISTORY — PX: REMOVAL OF BILATERAL TISSUE EXPANDERS WITH PLACEMENT OF BILATERAL BREAST IMPLANTS: SHX6431

## 2024-02-01 SURGERY — REMOVAL, TISSUE EXPANDER, BREAST, BILATERAL, WITH BILATERAL IMPLANT IMPLANT INSERTION
Anesthesia: General | Site: Chest | Laterality: Bilateral

## 2024-02-01 MED ORDER — POVIDONE-IODINE 10 % EX SOLN
CUTANEOUS | Status: DC | PRN
  Administered 2024-02-01: 1 via TOPICAL

## 2024-02-01 MED ORDER — SODIUM CHLORIDE 0.9 % IV SOLN
INTRAVENOUS | Status: DC | PRN
  Administered 2024-02-01: 500 mL

## 2024-02-01 MED ORDER — GABAPENTIN 300 MG PO CAPS
ORAL_CAPSULE | ORAL | Status: AC
  Filled 2024-02-01: qty 1

## 2024-02-01 MED ORDER — BUPIVACAINE HCL (PF) 0.5 % IJ SOLN
INTRAMUSCULAR | Status: DC | PRN
  Administered 2024-02-01: 30 mL

## 2024-02-01 MED ORDER — DEXAMETHASONE SODIUM PHOSPHATE 10 MG/ML IJ SOLN
INTRAMUSCULAR | Status: AC
  Filled 2024-02-01: qty 1

## 2024-02-01 MED ORDER — MIDAZOLAM HCL 2 MG/2ML IJ SOLN
INTRAMUSCULAR | Status: AC
Start: 2024-02-01 — End: ?
  Filled 2024-02-01: qty 2

## 2024-02-01 MED ORDER — SODIUM CHLORIDE 0.9 % IV SOLN
INTRAVENOUS | Status: AC
  Filled 2024-02-01: qty 10

## 2024-02-01 MED ORDER — ONDANSETRON HCL 4 MG/2ML IJ SOLN
INTRAMUSCULAR | Status: DC | PRN
  Administered 2024-02-01: 4 mg via INTRAVENOUS

## 2024-02-01 MED ORDER — LIDOCAINE 2% (20 MG/ML) 5 ML SYRINGE
INTRAMUSCULAR | Status: DC | PRN
  Administered 2024-02-01: 100 mg via INTRAVENOUS

## 2024-02-01 MED ORDER — CELECOXIB 200 MG PO CAPS
ORAL_CAPSULE | ORAL | Status: AC
  Filled 2024-02-01: qty 1

## 2024-02-01 MED ORDER — PROPOFOL 10 MG/ML IV BOLUS
INTRAVENOUS | Status: AC
  Filled 2024-02-01: qty 20

## 2024-02-01 MED ORDER — CEFAZOLIN SODIUM-DEXTROSE 2-4 GM/100ML-% IV SOLN
INTRAVENOUS | Status: AC
  Filled 2024-02-01: qty 100

## 2024-02-01 MED ORDER — OXYCODONE HCL 5 MG/5ML PO SOLN: 5.0000 mg | Freq: Once | ORAL | Status: DC | PRN

## 2024-02-01 MED ORDER — CELECOXIB 200 MG PO CAPS
200.0000 mg | ORAL_CAPSULE | ORAL | Status: AC
  Administered 2024-02-01: 200 mg via ORAL

## 2024-02-01 MED ORDER — ACETAMINOPHEN 500 MG PO TABS
ORAL_TABLET | ORAL | Status: AC
  Filled 2024-02-01: qty 2

## 2024-02-01 MED ORDER — FENTANYL CITRATE (PF) 100 MCG/2ML IJ SOLN
25.0000 ug | INTRAMUSCULAR | Status: DC | PRN
  Administered 2024-02-01: 50 ug via INTRAVENOUS

## 2024-02-01 MED ORDER — GABAPENTIN 300 MG PO CAPS
300.0000 mg | ORAL_CAPSULE | ORAL | Status: AC
  Administered 2024-02-01: 300 mg via ORAL

## 2024-02-01 MED ORDER — FENTANYL CITRATE (PF) 100 MCG/2ML IJ SOLN
INTRAMUSCULAR | Status: AC
  Filled 2024-02-01: qty 2

## 2024-02-01 MED ORDER — ONDANSETRON HCL 4 MG/2ML IJ SOLN
INTRAMUSCULAR | Status: AC
  Filled 2024-02-01: qty 2

## 2024-02-01 MED ORDER — ONDANSETRON HCL 4 MG/2ML IJ SOLN: 4.0000 mg | Freq: Once | INTRAMUSCULAR | Status: DC | PRN

## 2024-02-01 MED ORDER — OXYCODONE HCL 5 MG PO TABS: 5.0000 mg | ORAL_TABLET | Freq: Once | ORAL | Status: DC | PRN

## 2024-02-01 MED ORDER — PROPOFOL 10 MG/ML IV BOLUS
INTRAVENOUS | Status: DC | PRN
  Administered 2024-02-01: 70 mg via INTRAVENOUS

## 2024-02-01 MED ORDER — DEXAMETHASONE SODIUM PHOSPHATE 10 MG/ML IJ SOLN
INTRAMUSCULAR | Status: DC | PRN
  Administered 2024-02-01: 10 mg via INTRAVENOUS

## 2024-02-01 MED ORDER — PHENYLEPHRINE 80 MCG/ML (10ML) SYRINGE FOR IV PUSH (FOR BLOOD PRESSURE SUPPORT)
PREFILLED_SYRINGE | INTRAVENOUS | Status: DC | PRN
  Administered 2024-02-01: 40 ug via INTRAVENOUS

## 2024-02-01 MED ORDER — LACTATED RINGERS IV SOLN: INTRAVENOUS | Status: DC

## 2024-02-01 MED ORDER — FENTANYL CITRATE (PF) 100 MCG/2ML IJ SOLN
INTRAMUSCULAR | Status: DC | PRN
  Administered 2024-02-01 (×2): 50 ug via INTRAVENOUS

## 2024-02-01 MED ORDER — PHENYLEPHRINE 80 MCG/ML (10ML) SYRINGE FOR IV PUSH (FOR BLOOD PRESSURE SUPPORT)
PREFILLED_SYRINGE | INTRAVENOUS | Status: AC
  Filled 2024-02-01: qty 10

## 2024-02-01 MED ORDER — CEFAZOLIN SODIUM-DEXTROSE 2-4 GM/100ML-% IV SOLN
2.0000 g | INTRAVENOUS | Status: AC
Start: 2024-02-01 — End: 2024-02-01
  Administered 2024-02-01: 2 g via INTRAVENOUS

## 2024-02-01 MED ORDER — LIDOCAINE 2% (20 MG/ML) 5 ML SYRINGE
INTRAMUSCULAR | Status: AC
  Filled 2024-02-01: qty 5

## 2024-02-01 MED ORDER — BUPIVACAINE HCL (PF) 0.5 % IJ SOLN
INTRAMUSCULAR | Status: AC
  Filled 2024-02-01: qty 30

## 2024-02-01 MED ORDER — ACETAMINOPHEN 500 MG PO TABS
1000.0000 mg | ORAL_TABLET | ORAL | Status: AC
Start: 2024-02-01 — End: 2024-02-01
  Administered 2024-02-01: 1000 mg via ORAL

## 2024-02-01 SURGICAL SUPPLY — 58 items
BAG DECANTER FOR FLEXI CONT (MISCELLANEOUS) ×1 IMPLANT
BINDER BREAST 3XL (GAUZE/BANDAGES/DRESSINGS) IMPLANT
BINDER BREAST LRG (GAUZE/BANDAGES/DRESSINGS) IMPLANT
BINDER BREAST MEDIUM (GAUZE/BANDAGES/DRESSINGS) IMPLANT
BINDER BREAST XLRG (GAUZE/BANDAGES/DRESSINGS) IMPLANT
BINDER BREAST XXLRG (GAUZE/BANDAGES/DRESSINGS) IMPLANT
BLADE SURG 10 STRL SS (BLADE) ×2 IMPLANT
BNDG GAUZE DERMACEA FLUFF 4 (GAUZE/BANDAGES/DRESSINGS) ×2 IMPLANT
CANISTER SUCT 1200ML W/VALVE (MISCELLANEOUS) ×1 IMPLANT
CHLORAPREP W/TINT 26 (MISCELLANEOUS) ×2 IMPLANT
COVER BACK TABLE 60X90IN (DRAPES) ×1 IMPLANT
COVER MAYO STAND STRL (DRAPES) ×1 IMPLANT
DERMABOND ADVANCED .7 DNX12 (GAUZE/BANDAGES/DRESSINGS) ×2 IMPLANT
DRAIN CHANNEL 15F RND FF W/TCR (WOUND CARE) IMPLANT
DRAPE TOP ARMCOVERS (MISCELLANEOUS) ×1 IMPLANT
DRAPE U-SHAPE 76X120 STRL (DRAPES) ×1 IMPLANT
DRAPE UTILITY XL STRL (DRAPES) ×1 IMPLANT
ELECT COATED BLADE 2.86 ST (ELECTRODE) ×1 IMPLANT
ELECTRODE BLDE 4.0 EZ CLN MEGD (MISCELLANEOUS) IMPLANT
ELECTRODE REM PT RTRN 9FT ADLT (ELECTROSURGICAL) ×1 IMPLANT
EVACUATOR SILICONE 100CC (DRAIN) IMPLANT
GAUZE PAD ABD 8X10 STRL (GAUZE/BANDAGES/DRESSINGS) ×2 IMPLANT
GLOVE BIO SURGEON STRL SZ 6 (GLOVE) ×2 IMPLANT
GLOVE BIOGEL PI IND STRL 7.0 (GLOVE) IMPLANT
GOWN STRL REUS W/ TWL LRG LVL3 (GOWN DISPOSABLE) ×2 IMPLANT
IMPL BREAST GEL 295 (Breast) IMPLANT
IMPL BREAST P4.8XFULL RND 345 (Breast) IMPLANT
KIT FILL ASEPTIC TRANSFER (MISCELLANEOUS) IMPLANT
MARKER SKIN DUAL TIP RULER LAB (MISCELLANEOUS) IMPLANT
NDL FILTER BLUNT 18X1 1/2 (NEEDLE) IMPLANT
NDL HYPO 25X1 1.5 SAFETY (NEEDLE) IMPLANT
NEEDLE FILTER BLUNT 18X1 1/2 (NEEDLE) IMPLANT
NEEDLE HYPO 25X1 1.5 SAFETY (NEEDLE) ×1 IMPLANT
PACK BASIN DAY SURGERY FS (CUSTOM PROCEDURE TRAY) ×1 IMPLANT
PENCIL SMOKE EVACUATOR (MISCELLANEOUS) ×1 IMPLANT
PIN SAFETY STERILE (MISCELLANEOUS) IMPLANT
SHEET MEDIUM DRAPE 40X70 STRL (DRAPES) ×1 IMPLANT
SIZER BRST 335CC (SIZER) IMPLANT
SIZER BRST P4.5X 295CC (SIZER) IMPLANT
SIZER BRST STRL LF S 325CC (SIZER) IMPLANT
SIZER BRST STRL LF S 345CC (SIZER) IMPLANT
SLEEVE SCD COMPRESS KNEE MED (STOCKING) ×1 IMPLANT
SPIKE FLUID TRANSFER (MISCELLANEOUS) IMPLANT
SPONGE T-LAP 18X18 ~~LOC~~+RFID (SPONGE) ×1 IMPLANT
STAPLER SKIN PROX WIDE 3.9 (STAPLE) ×1 IMPLANT
SUT ETHILON 2 0 FS 18 (SUTURE) IMPLANT
SUT MNCRL AB 4-0 PS2 18 (SUTURE) IMPLANT
SUT PDS AB 2-0 CT2 27 (SUTURE) IMPLANT
SUT VIC AB 3-0 PS1 18XBRD (SUTURE) IMPLANT
SUT VIC AB 3-0 SH 27X BRD (SUTURE) IMPLANT
SUT VIC AB 4-0 PS2 18 (SUTURE) IMPLANT
SYR 20ML LL LF (SYRINGE) IMPLANT
SYR BULB IRRIG 60ML STRL (SYRINGE) ×2 IMPLANT
SYR CONTROL 10ML LL (SYRINGE) IMPLANT
TOWEL GREEN STERILE FF (TOWEL DISPOSABLE) ×2 IMPLANT
TUBE CONNECTING 20X1/4 (TUBING) ×1 IMPLANT
UNDERPAD 30X36 HEAVY ABSORB (UNDERPADS AND DIAPERS) ×2 IMPLANT
YANKAUER SUCT BULB TIP NO VENT (SUCTIONS) ×1 IMPLANT

## 2024-02-01 NOTE — Anesthesia Procedure Notes (Signed)
 Procedure Name: LMA Insertion Date/Time: 02/01/2024 7:37 AM  Performed by: Adolphus Hoops, CRNAPre-anesthesia Checklist: Patient identified, Emergency Drugs available, Suction available and Patient being monitored Patient Re-evaluated:Patient Re-evaluated prior to induction Oxygen Delivery Method: Circle System Utilized Preoxygenation: Pre-oxygenation with 100% oxygen Induction Type: IV induction Ventilation: Mask ventilation without difficulty LMA: LMA inserted LMA Size: 3.0 Number of attempts: 1 Airway Equipment and Method: Bite block Placement Confirmation: positive ETCO2 Tube secured with: Tape Dental Injury: Teeth and Oropharynx as per pre-operative assessment

## 2024-02-01 NOTE — Discharge Instructions (Addendum)
 May take next dose Tylenol  after 1pm. May take next dose ibuprofen/aleve after 1pm. Oxycodone  taken at 10:00 am. May take next dose oxycodone  after interval indicated on bottle.   Post Anesthesia Home Care Instructions  Activity: Get plenty of rest for the remainder of the day. A responsible individual must stay with you for 24 hours following the procedure.  For the next 24 hours, DO NOT: -Drive a car -Advertising copywriter -Drink alcoholic beverages -Take any medication unless instructed by your physician -Make any legal decisions or sign important papers.  Meals: Start with liquid foods such as gelatin or soup. Progress to regular foods as tolerated. Avoid greasy, spicy, heavy foods. If nausea and/or vomiting occur, drink only clear liquids until the nausea and/or vomiting subsides. Call your physician if vomiting continues.  Special Instructions/Symptoms: Your throat may feel dry or sore from the anesthesia or the breathing tube placed in your throat during surgery. If this causes discomfort, gargle with warm salt water. The discomfort should disappear within 24 hours.  If you had a scopolamine patch placed behind your ear for the management of post- operative nausea and/or vomiting:  1. The medication in the patch is effective for 72 hours, after which it should be removed.  Wrap patch in a tissue and discard in the trash. Wash hands thoroughly with soap and water. 2. You may remove the patch earlier than 72 hours if you experience unpleasant side effects which may include dry mouth, dizziness or visual disturbances. 3. Avoid touching the patch. Wash your hands with soap and water after contact with the patch.

## 2024-02-01 NOTE — Transfer of Care (Signed)
 Immediate Anesthesia Transfer of Care Note  Patient: Colleen Golden  Procedure(s) Performed: REMOVAL BILATERAL CHEST TISSUE EXPANDERS AND PLACEMENT SILICONE IMPLANTS (Bilateral: Chest)  Patient Location: PACU  Anesthesia Type:General  Level of Consciousness: drowsy  Airway & Oxygen Therapy: Patient Spontanous Breathing and Patient connected to face mask oxygen  Post-op Assessment: Report given to RN and Post -op Vital signs reviewed and stable  Post vital signs: Reviewed and stable  Last Vitals:  Vitals Value Taken Time  BP 141/88 02/01/24 0900  Temp    Pulse 83 02/01/24 0901  Resp 11 02/01/24 0901  SpO2 100 % 02/01/24 0901  Vitals shown include unfiled device data.  Last Pain:  Vitals:   02/01/24 0630  TempSrc: Temporal  PainSc: 0-No pain      Patients Stated Pain Goal: 5 (02/01/24 0630)  Complications: No notable events documented.

## 2024-02-01 NOTE — Anesthesia Preprocedure Evaluation (Addendum)
 Anesthesia Evaluation  Patient identified by MRN, date of birth, ID band Patient awake    Reviewed: Allergy & Precautions, NPO status , Patient's Chart, lab work & pertinent test results, reviewed documented beta blocker date and time   History of Anesthesia Complications Negative for: history of anesthetic complications  Airway Mallampati: II  TM Distance: >3 FB Neck ROM: Full  Mouth opening: Limited Mouth Opening  Dental no notable dental hx.    Pulmonary neg COPD, neg PE   breath sounds clear to auscultation       Cardiovascular (-) hypertension(-) angina (-) CAD and (-) Past MI  Rhythm:Regular Rate:Normal     Neuro/Psych    GI/Hepatic ,,,(+) neg Cirrhosis        Endo/Other  Hypothyroidism    Renal/GU Renal disease     Musculoskeletal  (+) Arthritis , Osteoarthritis,    Abdominal   Peds  Hematology   Anesthesia Other Findings   Reproductive/Obstetrics                             Anesthesia Physical Anesthesia Plan  ASA: 2  Anesthesia Plan: General   Post-op Pain Management:    Induction: Intravenous  PONV Risk Score and Plan: 2 and Ondansetron  and Dexamethasone   Airway Management Planned: LMA  Additional Equipment:   Intra-op Plan:   Post-operative Plan: Extubation in OR  Informed Consent: I have reviewed the patients History and Physical, chart, labs and discussed the procedure including the risks, benefits and alternatives for the proposed anesthesia with the patient or authorized representative who has indicated his/her understanding and acceptance.     Dental advisory given  Plan Discussed with: CRNA  Anesthesia Plan Comments:         Anesthesia Quick Evaluation

## 2024-02-01 NOTE — Anesthesia Postprocedure Evaluation (Signed)
 Anesthesia Post Note  Patient: Colleen Golden  Procedure(s) Performed: REMOVAL BILATERAL CHEST TISSUE EXPANDERS AND PLACEMENT SILICONE IMPLANTS (Bilateral: Chest)     Patient location during evaluation: PACU Anesthesia Type: General Level of consciousness: awake and alert Pain management: pain level controlled Vital Signs Assessment: post-procedure vital signs reviewed and stable Respiratory status: spontaneous breathing, nonlabored ventilation, respiratory function stable and patient connected to nasal cannula oxygen Cardiovascular status: blood pressure returned to baseline and stable Postop Assessment: no apparent nausea or vomiting Anesthetic complications: no   No notable events documented.  Last Vitals:  Vitals:   02/01/24 0949 02/01/24 0958  BP:  (!) 128/91  Pulse: 73 71  Resp: 16 16  Temp:  36.6 C  SpO2: 98% 97%    Last Pain:  Vitals:   02/01/24 0958  TempSrc:   PainSc: 3                  Leslye Rast

## 2024-02-01 NOTE — Interval H&P Note (Signed)
 History and Physical Interval Note:  02/01/2024 6:57 AM  Colleen Golden  has presented today for surgery, with the diagnosis of history breast cancer acquired absence breasts.  The various methods of treatment have been discussed with the patient and family. After consideration of risks, benefits and other options for treatment, the patient has consented to  Procedure(s) with comments: REMOVAL, TISSUE EXPANDER, BREAST, BILATERAL, WITH BILATERAL IMPLANT IMPLANT INSERTION (Bilateral) - * REMOVAL BILATERAL CHEST TE AND PLACEMENT SILICONE IMPLANTS* as a surgical intervention.  The patient's history has been reviewed, patient examined, no change in status, stable for surgery.  I have reviewed the patient's chart and labs.  Questions were answered to the patient's satisfaction.     Lisabeth Rider Marleah Beever

## 2024-02-01 NOTE — Op Note (Signed)
 Operative Note   DATE OF OPERATION: 5.30.2025  LOCATION: [facility]-outpatient  SURGICAL DIVISION: Plastic Surgery  PREOPERATIVE DIAGNOSES:  1. History breast cancer 2. Acquired absence breasts  POSTOPERATIVE DIAGNOSES:  same  PROCEDURE:  Removal bilateral chest tissue expanders and placement silicone implants  SURGEON: Alger Infield MD MBA  ASSISTANT: none  ANESTHESIA:  General.   EBL: 20 ml  COMPLICATIONS: None immediate.   INDICATIONS FOR PROCEDURE:  The patient, Colleen Golden, is a 58 y.o. female born on 06-Feb-1966, is here for staged breast reconstruction following nipple sparing mastectomies with prepectoral tissue expander acellular dermis reconstruction.   FINDINGS: Complete incorporation ADM noted bilateral. Significant asymmetry chest wall noted with left more projecting. Natrelle Soft Touch Full Projection smooth round silicone implants placed bilateral. RIGHT 345 ml REF SSF-345 SN [id number] LEFT 295 ml REF SSF-295 SN [id number]  DESCRIPTION OF PROCEDURE:  The patient's operative site was marked with the patient in the preoperative area. The patient was taken to the operating room. SCDs were placed and IV antibiotics were given. The patient's operative site was prepped and draped in a sterile fashion. A time out was performed and all information was confirmed to be correct. Incision made in left inframammary fold and carried through superficial fascia and acellular dermis. Expander removed. superior capsulotomy performed. Additional scoring acellular dermis over lower pole completed. Sizer placed. Over right chest, incision made in inframammary fold scar and carried through superficial fascia and acellular dermis. Expander removed. Sizer placed. Patient brought to upright sitting position. A Full Projection 295 ml implant selected for left chest and a Full projection 345 ml implant selected for right chest placement. Patient returned to supine position.    Local  anesthetic infiltrated through bilateral chest. Left chest cavity irrigated with saline solution containing Ancef , gentamicin , and Betadine . Hemostasis ensured. The implant was placed in left chest, and implant orientation ensured. Closure completed with 3-0 vicryl to close superficial fascia and ADM over implant. 4-0 vicryl used to close dermis followed by 4-0 monocryl subcuticular. Over right chest, cavity irrigated with saline solution containing Ancef , gentamicin , and Betadine . Hemostasis ensured. Implant placed in right chest cavity, and implant orientation ensured. Closure completed with 3-0 vicryl in superficial fascia and ADM, 4-0 vicryl in dermis, and 4-0 monocryl subcuticular skin closure. Dermabond applied to incisions. Dry dressing, followed by breast binder applied.   The patient was allowed to wake from anesthesia, extubated and taken to the recovery room in satisfactory condition.   SPECIMENS: none  DRAINS: none  Alger Infield, MD [facility]  Office/ physician access line after hours [phone]

## 2024-02-04 ENCOUNTER — Ambulatory Visit: Attending: General Surgery | Admitting: Physical Therapy

## 2024-02-04 ENCOUNTER — Encounter (HOSPITAL_BASED_OUTPATIENT_CLINIC_OR_DEPARTMENT_OTHER): Payer: Self-pay | Admitting: Plastic Surgery

## 2024-02-04 DIAGNOSIS — Z483 Aftercare following surgery for neoplasm: Secondary | ICD-10-CM | POA: Insufficient documentation

## 2024-02-04 NOTE — Therapy (Signed)
 OUTPATIENT PHYSICAL THERAPY SOZO SCREENING NOTE   Patient Name: Alicen Donalson MRN: [id number] DOB:Dec 30, 1965, 58 y.o., female Today's Date: 02/04/2024  PCP: Belle Box, MD REFERRING PROVIDER: Lockie Rima, MD   PT End of Session - 02/04/24 1623     Visit Number 2    Number of Visits 2    PT Start Time 1105    PT Stop Time 1115    PT Time Calculation (min) 10 min    Activity Tolerance Patient tolerated treatment well    Behavior During Therapy [facility] for tasks assessed/performed             Past Medical History:  Diagnosis Date   Arthritis    Cancer (HCC)    Breast   Pre-diabetes    Thyroid  disease    Past Surgical History:  Procedure Laterality Date   BREAST BIOPSY Right 09/21/2023   US  RT BREAST BX W LOC DEV 1ST LESION IMG BX SPEC US  GUIDE 09/21/2023 [facility] MAMMOGRAPHY   BREAST RECONSTRUCTION WITH PLACEMENT OF TISSUE EXPANDER AND ALLODERM Bilateral 11/13/2023   Procedure: BREAST RECONSTRUCTION WITH PLACEMENT OF TISSUE EXPANDER AND ALLODERM;  Surgeon: Alger Infield, MD;  Location: [facility];  Service: Plastics;  Laterality: Bilateral;   DILATION AND CURETTAGE OF UTERUS     NIPPLE SPARING MASTECTOMY Left 11/13/2023   Procedure: LEFT NIPPLE SPARING MASTECTOMY;  Surgeon: Lockie Rima, MD;  Location: [facility];  Service: General;  Laterality: Left;   NIPPLE SPARING MASTECTOMY WITH SENTINEL LYMPH NODE BIOPSY Right 11/13/2023   Procedure: RIGHT NIPPLE SPARING MASTECTOMY WITH RIGHT SENTINEL LYMPH NODE BIOPSY;  Surgeon: Lockie Rima, MD;  Location: [facility];  Service: General;  Laterality: Right;   REMOVAL OF BILATERAL TISSUE EXPANDERS WITH PLACEMENT OF BILATERAL BREAST IMPLANTS Bilateral 02/01/2024   Procedure: REMOVAL BILATERAL CHEST TISSUE EXPANDERS AND PLACEMENT SILICONE IMPLANTS;  Surgeon: Alger Infield, MD;  Location: [facility];  Service: Plastics;  Laterality: Bilateral;  * REMOVAL BILATERAL CHEST TE  AND PLACEMENT SILICONE IMPLANTS*   THYROIDECTOMY N/A 05/29/2013   Procedure: TOTAL THYROIDECTOMY;  Surgeon: Keitha Pata, MD;  Location: [facility];  Service: General;  Laterality: N/A;   TONSILLECTOMY     TOTAL HIP ARTHROPLASTY Right 2010   Patient Active Problem List   Diagnosis Date Noted   Breast cancer, left breast (HCC) 11/13/2023   Genetic testing 10/12/2023   Malignant neoplasm of upper-inner quadrant of right breast in female, estrogen receptor positive (HCC) 10/01/2023   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 It measures 6 mm and is located in the upper inner quadrant. It is ER/PR positive and HER2 negative with a Ki67 of 5%. On 11/13/2023 pt had bilateral nipple sparing mastectomy with placement of expanders.  0/5 lymph nodes removed from right axilla , 0/1 on left . She plans to have hormonal therapy but no radiation.   PRECAUTIONS: right UE Lymphedema risk  SUBJECTIVE: Here for SOZO screen  PAIN:  Are you having pain? No  SOZO SCREENING: Patient was assessed today using the SOZO machine to determine the lymphedema index score. This was compared to her baseline score. It was determined that she is within the recommended range when compared to her baseline and no further action is needed at  this time. She will continue SOZO screenings. These are done every 3 months for 2 years post operatively followed by every 6 months for 2 years, and then annually.   L-DEX FLOWSHEETS - 02/04/24 1600       L-DEX LYMPHEDEMA SCREENING   Measurement Type Unilateral    L-DEX MEASUREMENT EXTREMITY Upper Extremity    POSITION  Standing    DOMINANT SIDE Right    At Risk Side Right    BASELINE SCORE (UNILATERAL) -5.9    L-DEX SCORE  (UNILATERAL) -8.3    VALUE CHANGE (UNILAT) -2.4            Rollin Clock, [place] 02/04/24 4:25 PM

## 2024-05-26 ENCOUNTER — Ambulatory Visit: Attending: General Surgery

## 2024-05-26 VITALS — Wt 105.4 lb

## 2024-05-26 DIAGNOSIS — Z483 Aftercare following surgery for neoplasm: Secondary | ICD-10-CM | POA: Insufficient documentation

## 2024-05-26 NOTE — Therapy (Signed)
 OUTPATIENT PHYSICAL THERAPY SOZO SCREENING NOTE   Patient Name: Colleen Golden MRN: [id number] DOB:03/17/1966, 58 y.o., female Today's Date: 05/26/2024  PCP: Marget Lenis, MD REFERRING PROVIDER: Aron Shoulders, MD   PT End of Session - 05/26/24 1056     Visit Number 2   # unchanged due to screen only   PT Start Time 1054    PT Stop Time 1058    PT Time Calculation (min) 4 min    Activity Tolerance Patient tolerated treatment well    Behavior During Therapy [name] for tasks assessed/performed          Past Medical History:  Diagnosis Date   Arthritis    Cancer (HCC)    Breast   Pre-diabetes    Thyroid  disease    Past Surgical History:  Procedure Laterality Date   BREAST BIOPSY Right 09/21/2023   US  RT BREAST BX W LOC DEV 1ST LESION IMG BX SPEC US  GUIDE 09/21/2023 [facility] MAMMOGRAPHY   BREAST RECONSTRUCTION WITH PLACEMENT OF TISSUE EXPANDER AND ALLODERM Bilateral 11/13/2023   Procedure: BREAST RECONSTRUCTION WITH PLACEMENT OF TISSUE EXPANDER AND ALLODERM;  Surgeon: Arelia Filippo, MD;  Location: [facility];  Service: Plastics;  Laterality: Bilateral;   DILATION AND CURETTAGE OF UTERUS     NIPPLE SPARING MASTECTOMY Left 11/13/2023   Procedure: LEFT NIPPLE SPARING MASTECTOMY;  Surgeon: Aron Shoulders, MD;  Location: [facility];  Service: General;  Laterality: Left;   NIPPLE SPARING MASTECTOMY WITH SENTINEL LYMPH NODE BIOPSY Right 11/13/2023   Procedure: RIGHT NIPPLE SPARING MASTECTOMY WITH RIGHT SENTINEL LYMPH NODE BIOPSY;  Surgeon: Aron Shoulders, MD;  Location: [facility];  Service: General;  Laterality: Right;   REMOVAL OF BILATERAL TISSUE EXPANDERS WITH PLACEMENT OF BILATERAL BREAST IMPLANTS Bilateral 02/01/2024   Procedure: REMOVAL BILATERAL CHEST TISSUE EXPANDERS AND PLACEMENT SILICONE IMPLANTS;  Surgeon: Arelia Filippo, MD;  Location: [facility];  Service: Plastics;  Laterality: Bilateral;  * REMOVAL BILATERAL CHEST  TE AND PLACEMENT SILICONE IMPLANTS*   THYROIDECTOMY N/A 05/29/2013   Procedure: TOTAL THYROIDECTOMY;  Surgeon: Krystal CHRISTELLA Spinner, MD;  Location: [facility];  Service: General;  Laterality: N/A;   TONSILLECTOMY     TOTAL HIP ARTHROPLASTY Right 2010   Patient Active Problem List   Diagnosis Date Noted   Breast cancer, left breast (HCC) 11/13/2023   Genetic testing 10/12/2023   Malignant neoplasm of upper-inner quadrant of right breast in female, estrogen receptor positive (HCC) 10/01/2023   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 It measures 6 mm and is located in the upper inner quadrant. It is ER/PR positive and HER2 negative with a Ki67 of 5%. On 11/13/2023 pt had bilateral nipple sparing mastectomy with placement of expanders.  0/5 lymph nodes removed from right axilla , 0/1 on left . She plans to have hormonal therapy but no radiation.   PRECAUTIONS: right UE Lymphedema risk  SUBJECTIVE: Here for 3 month SOZO screen  PAIN:  Are you having pain? No  SOZO SCREENING: Patient was assessed today using the SOZO machine to determine the lymphedema index score. This was compared to her baseline score. It was determined that she is within the recommended range when compared to her baseline and no further action is needed at this time.  She will continue SOZO screenings. These are done every 3 months for 2 years post operatively followed by every 6 months for 2 years, and then annually.     L-DEX FLOWSHEETS - 05/26/24 1000       L-DEX LYMPHEDEMA SCREENING   Measurement Type Unilateral    L-DEX MEASUREMENT EXTREMITY Upper Extremity    POSITION  Standing    DOMINANT SIDE Right    At Risk Side Right    BASELINE SCORE (UNILATERAL) -5.9    L-DEX  SCORE (UNILATERAL) -7.8    VALUE CHANGE (UNILAT) -1.9           Berwyn Knights, PTA 05/26/24 10:58 AM

## 2024-06-02 ENCOUNTER — Other Ambulatory Visit: Payer: Self-pay

## 2024-06-02 ENCOUNTER — Inpatient Hospital Stay: Admitting: Adult Health

## 2024-06-02 ENCOUNTER — Encounter: Payer: Self-pay | Admitting: Hematology and Oncology

## 2024-06-02 ENCOUNTER — Inpatient Hospital Stay: Attending: Hematology and Oncology

## 2024-06-02 ENCOUNTER — Encounter: Payer: Self-pay | Admitting: Adult Health

## 2024-06-02 VITALS — BP 130/87 | HR 62 | Temp 97.8°F | Resp 18 | Ht 64.0 in | Wt 105.2 lb

## 2024-06-02 DIAGNOSIS — C50211 Malignant neoplasm of upper-inner quadrant of right female breast: Secondary | ICD-10-CM

## 2024-06-02 DIAGNOSIS — E890 Postprocedural hypothyroidism: Secondary | ICD-10-CM

## 2024-06-02 DIAGNOSIS — R232 Flushing: Secondary | ICD-10-CM | POA: Insufficient documentation

## 2024-06-02 DIAGNOSIS — Z170 Estrogen receptor positive status [ER+]: Secondary | ICD-10-CM | POA: Insufficient documentation

## 2024-06-02 DIAGNOSIS — Z79810 Long term (current) use of selective estrogen receptor modulators (SERMs): Secondary | ICD-10-CM | POA: Insufficient documentation

## 2024-06-02 DIAGNOSIS — Z1721 Progesterone receptor positive status: Secondary | ICD-10-CM | POA: Diagnosis not present

## 2024-06-02 DIAGNOSIS — Z803 Family history of malignant neoplasm of breast: Secondary | ICD-10-CM | POA: Insufficient documentation

## 2024-06-02 DIAGNOSIS — E559 Vitamin D deficiency, unspecified: Secondary | ICD-10-CM

## 2024-06-02 DIAGNOSIS — R5383 Other fatigue: Secondary | ICD-10-CM | POA: Insufficient documentation

## 2024-06-02 DIAGNOSIS — Z1732 Human epidermal growth factor receptor 2 negative status: Secondary | ICD-10-CM | POA: Insufficient documentation

## 2024-06-02 LAB — CBC WITH DIFFERENTIAL (CANCER CENTER ONLY)
Abs Immature Granulocytes: 0.01 K/uL (ref 0.00–0.07)
Basophils Absolute: 0 K/uL (ref 0.0–0.1)
Basophils Relative: 1 %
Eosinophils Absolute: 0 K/uL (ref 0.0–0.5)
Eosinophils Relative: 1 %
HCT: 43.5 % (ref 36.0–46.0)
Hemoglobin: 14.8 g/dL (ref 12.0–15.0)
Immature Granulocytes: 0 %
Lymphocytes Relative: 23 %
Lymphs Abs: 1.3 K/uL (ref 0.7–4.0)
MCH: 29.8 pg (ref 26.0–34.0)
MCHC: 34 g/dL (ref 30.0–36.0)
MCV: 87.5 fL (ref 80.0–100.0)
Monocytes Absolute: 0.3 K/uL (ref 0.1–1.0)
Monocytes Relative: 5 %
Neutro Abs: 4 K/uL (ref 1.7–7.7)
Neutrophils Relative %: 70 %
Platelet Count: 275 K/uL (ref 150–400)
RBC: 4.97 MIL/uL (ref 3.87–5.11)
RDW: 12.2 % (ref 11.5–15.5)
WBC Count: 5.6 K/uL (ref 4.0–10.5)
nRBC: 0 % (ref 0.0–0.2)

## 2024-06-02 LAB — CMP (CANCER CENTER ONLY)
ALT: 14 U/L (ref 0–44)
AST: 19 U/L (ref 15–41)
Albumin: 4.6 g/dL (ref 3.5–5.0)
Alkaline Phosphatase: 64 U/L (ref 38–126)
Anion gap: 6 (ref 5–15)
BUN: 21 mg/dL — ABNORMAL HIGH (ref 6–20)
CO2: 29 mmol/L (ref 22–32)
Calcium: 9.1 mg/dL (ref 8.9–10.3)
Chloride: 106 mmol/L (ref 98–111)
Creatinine: 0.82 mg/dL (ref 0.44–1.00)
GFR, Estimated: >60 mL/min (ref >60)
Glucose, Bld: 95 mg/dL (ref 70–99)
Potassium: 4 mmol/L (ref 3.5–5.1)
Sodium: 141 mmol/L (ref 135–145)
Total Bilirubin: 0.4 mg/dL (ref 0.0–1.2)
Total Protein: 6.9 g/dL (ref 6.5–8.1)

## 2024-06-02 LAB — TSH: TSH: 0.382 u[IU]/mL (ref 0.350–4.500)

## 2024-06-02 LAB — T4, FREE: Free T4: 1.28 ng/dL — ABNORMAL HIGH (ref 0.61–1.12)

## 2024-06-02 LAB — VITAMIN D 25 HYDROXY (VIT D DEFICIENCY, FRACTURES): Vit D, 25-Hydroxy: 40.92 ng/mL (ref 30–100)

## 2024-06-02 NOTE — Progress Notes (Signed)
 SURVIVORSHIP VISIT:  BRIEF ONCOLOGIC HISTORY:  Oncology History  Malignant neoplasm of upper-inner quadrant of right breast in female, estrogen receptor positive (HCC)  09/18/2023 Breast MRI   New 0.6 cm far posterior UPPER INNER RIGHT breast mass and new 0.8 cm LOWER OUTER RIGHT breast mass. 2nd-look ultrasound with possible biopsies (if visualized sonographically) recommended. New 0.6 cm linear non masslike enhancement within the anterior UPPER LEFT breast. MR guided biopsy recommended. No abnormal appearing lymph nodes.     09/21/2023 Pathology Results   Biopsy from the right breast 2:00 showed grade 2 invasive lobular carcinoma, focal LCIS, estrogen 50% positive weak staining intensity, progesterone receptor 100% positive strong staining intensity, HER2 1+ Ki-67 of 5%   10/01/2023 Initial Diagnosis   Malignant neoplasm of upper-inner quadrant of right breast in female, estrogen receptor positive (HCC)   10/03/2023 Cancer Staging   Staging form: Breast, AJCC 8th Edition - Clinical stage from 10/03/2023: Stage IA (cT1b, cN0, cM0, G2, ER+, PR+, HER2-) - Signed by Loretha Ash, MD on 10/03/2023 Stage prefix: Initial diagnosis Histologic grading system: 3 grade system Laterality: Right Staged by: Pathologist and managing physician Stage used in treatment planning: Yes National guidelines used in treatment planning: Yes Type of national guideline used in treatment planning: NCCN   10/11/2023 Genetic Testing   Negative [facility] CancerNext-Expanded +RNAinsight Panel.  VUS in POLE at c.6531+4C>T. Report date is 10/11/2023.   The CancerNext-Expanded gene panel offered by [facility] and includes sequencing, rearrangement, and RNA analysis for the following 76 genes: AIP, ALK, APC, ATM, AXIN2, BAP1, BARD1, BMPR1A, BRCA1, BRCA2, BRIP1, CDC73, CDH1, CDK4, CDKN1B, CDKN2A, CEBPA, CHEK2, CTNNA1, DDX41, DICER1, ETV6, FH, FLCN, GATA2, LZTR1, MAX, MBD4, MEN1, MET, MLH1, MSH2, MSH3, MSH6, MUTYH, NF1, NF2,  NTHL1, PALB2, PHOX2B, PMS2, POT1, PRKAR1A, PTCH1, PTEN, RAD51C, RAD51D, RB1, RET, RUNX1, SDHA, SDHAF2, SDHB, SDHC, SDHD, SMAD4, SMARCA4, SMARCB1, SMARCE1, STK11, SUFU, TMEM127, TP53, TSC1, TSC2, VHL, and WT1 (sequencing and deletion/duplication); EGFR, HOXB13, KIT, MITF, PDGFRA, POLD1, and POLE (sequencing only); EPCAM and GREM1 (deletion/duplication only).    11/13/2023 Definitive Surgery   She had bilateral mastectomy which showed grade 2 ILC in the right breast measuring 1 cm LCIS, neg margins, ER positive, PR positive, Her 2 neg, Ki 67 5%. 5/5 LN without any evidence of malignancy.   11/13/2023 Oncotype testing   Oncotype Dx of 18, no added benefit of chemotherapy.   02/2024 -  Anti-estrogen oral therapy   Tamoxifen      INTERVAL HISTORY:  Discussed the use of AI scribe software for clinical note transcription with the patient, who gave verbal consent to proceed.  History of Present Illness Colleen Golden is a 58 year old female with a history of breast cancer who presents with hot flashes and sleep disturbances.  She experiences frequent nocturnal hot flashes that disrupt her sleep, waking her three to four times per night. The hot flashes are described as feeling 'hot, cold, and clammy' and are more frequent with activity. She uses a fan at night for relief but finds the situation distressing.  She has been on tamoxifen  for two months, taken at night, which coincides with the peak of her symptoms. Her medical history includes stage 1A invasive lobular carcinoma in the right breast, estrogen and progesterone receptor positive, treated with bilateral mastectomies and negative lymph nodes. Genetic testing was negative, and chemotherapy was not indicated. Her family history includes breast cancer in her mother and grandmother.  She has reduced alcohol intake, particularly wine, as it exacerbates  her hot flashes. She is not experiencing any post-surgical side effects such as pain or  lymphedema and maintains a normal BMI. No mood changes are present.    REVIEW OF SYSTEMS:  Review of Systems  Constitutional:  Positive for fatigue. Negative for appetite change, chills, fever and unexpected weight change.  HENT:   Negative for hearing loss, lump/mass and trouble swallowing.   Eyes:  Negative for eye problems and icterus.  Respiratory:  Negative for chest tightness, cough and shortness of breath.   Cardiovascular:  Negative for chest pain, leg swelling and palpitations.  Gastrointestinal:  Negative for abdominal distention, abdominal pain, constipation, diarrhea, nausea and vomiting.  Endocrine: Positive for hot flashes.  Genitourinary:  Negative for difficulty urinating.   Musculoskeletal:  Negative for arthralgias.  Skin:  Negative for itching and rash.  Neurological:  Negative for dizziness, extremity weakness, headaches and numbness.  Hematological:  Negative for adenopathy. Does not bruise/bleed easily.  Psychiatric/Behavioral:  Negative for depression. The patient is not nervous/anxious.    Breast: Denies any new nodularity, masses, tenderness, nipple changes, or nipple discharge.       PAST MEDICAL/SURGICAL HISTORY:  Past Medical History:  Diagnosis Date   Arthritis    Cancer (HCC)    Breast   Pre-diabetes    Thyroid  disease    Past Surgical History:  Procedure Laterality Date   BREAST BIOPSY Right 09/21/2023   US  RT BREAST BX W LOC DEV 1ST LESION IMG BX SPEC US  GUIDE 09/21/2023 [facility] MAMMOGRAPHY   BREAST RECONSTRUCTION WITH PLACEMENT OF TISSUE EXPANDER AND ALLODERM Bilateral 11/13/2023   Procedure: BREAST RECONSTRUCTION WITH PLACEMENT OF TISSUE EXPANDER AND ALLODERM;  Surgeon: Arelia Filippo, MD;  Location: [facility];  Service: Plastics;  Laterality: Bilateral;   DILATION AND CURETTAGE OF UTERUS     NIPPLE SPARING MASTECTOMY Left 11/13/2023   Procedure: LEFT NIPPLE SPARING MASTECTOMY;  Surgeon: Aron Shoulders, MD;  Location: [facility];  Service: General;  Laterality: Left;   NIPPLE SPARING MASTECTOMY WITH SENTINEL LYMPH NODE BIOPSY Right 11/13/2023   Procedure: RIGHT NIPPLE SPARING MASTECTOMY WITH RIGHT SENTINEL LYMPH NODE BIOPSY;  Surgeon: Aron Shoulders, MD;  Location: [facility];  Service: General;  Laterality: Right;   REMOVAL OF BILATERAL TISSUE EXPANDERS WITH PLACEMENT OF BILATERAL BREAST IMPLANTS Bilateral 02/01/2024   Procedure: REMOVAL BILATERAL CHEST TISSUE EXPANDERS AND PLACEMENT SILICONE IMPLANTS;  Surgeon: Arelia Filippo, MD;  Location: [facility];  Service: Plastics;  Laterality: Bilateral;  * REMOVAL BILATERAL CHEST TE AND PLACEMENT SILICONE IMPLANTS*   THYROIDECTOMY N/A 05/29/2013   Procedure: TOTAL THYROIDECTOMY;  Surgeon: Krystal CHRISTELLA Spinner, MD;  Location: [facility];  Service: General;  Laterality: N/A;   TONSILLECTOMY     TOTAL HIP ARTHROPLASTY Right 2010     ALLERGIES:  No Known Allergies   CURRENT MEDICATIONS:  Outpatient Encounter Medications as of 06/02/2024  Medication Sig   levothyroxine  (SYNTHROID ) 100 MCG tablet Take 100 mcg by mouth daily before breakfast.   liothyronine  (CYTOMEL ) 5 MCG tablet Take 5 mcg by mouth daily.   Multiple Vitamin (MULTIVITAMIN) capsule Take 1 capsule by mouth daily.   tamoxifen  (NOLVADEX ) 20 MG tablet Take 1 tablet (20 mg total) by mouth daily.   No facility-administered encounter medications on file as of 06/02/2024.     ONCOLOGIC FAMILY HISTORY:  Family History  Problem Relation Age of Onset   Breast cancer Mother 64       lumpectomy, x2 re-excisions   Prostate  cancer Father        dx late 67s; recurrence w/ mets in 14s   Breast cancer Paternal Aunt        dx >50; bil mast   Breast cancer Paternal Aunt        dx 30s; d. 10s   Breast cancer Maternal Grandmother        dx before age 53   Breast cancer Cousin        pat female cousin; dx <50   Colon cancer Neg Hx    Colon polyps Neg Hx    Esophageal cancer Neg Hx     Stomach cancer Neg Hx      SOCIAL HISTORY:  Social History   Socioeconomic History   Marital status: Married    Spouse name: Not on file   Number of children: Not on file   Years of education: Not on file   Highest education level: Not on file  Occupational History   Not on file  Tobacco Use   Smoking status: Never   Smokeless tobacco: Never  Vaping Use   Vaping status: Never Used  Substance and Sexual Activity   Alcohol use: Yes    Comment: 2/3 a week   Drug use: No   Sexual activity: Not on file  Other Topics Concern   Not on file  Social History Narrative   Not on file   Social Drivers of Health   Financial Resource Strain: Not on file  Food Insecurity: No Food Insecurity (10/03/2023)   Hunger Vital Sign    Worried About Running Out of Food in the Last Year: Never true    Ran Out of Food in the Last Year: Never true  Transportation Needs: No Transportation Needs (10/03/2023)   PRAPARE - Administrator, Civil Service (Medical): No    Lack of Transportation (Non-Medical): No  Physical Activity: Not on file  Stress: Not on file  Social Connections: Not on file  Intimate Partner Violence: Not At Risk (10/03/2023)   Humiliation, Afraid, Rape, and Kick questionnaire    Fear of Current or Ex-Partner: No    Emotionally Abused: No    Physically Abused: No    Sexually Abused: No     OBSERVATIONS/OBJECTIVE:  BP 130/87 (BP Location: Right Arm, Patient Position: Sitting)   Pulse 62   Temp 97.8 F (36.6 C) (Tympanic)   Resp 18   Ht 5' 4 (1.626 m)   Wt 105 lb 3.2 oz (47.7 kg)   SpO2 100%   BMI 18.06 kg/m  GENERAL: Patient is a well appearing female in no acute distress HEENT:  Sclerae anicteric.  Oropharynx clear and moist. No ulcerations or evidence of oropharyngeal candidiasis. Neck is supple.  NODES:  No cervical, supraclavicular, or axillary lymphadenopathy palpated.  BREAST EXAM: s/p bilateral mastectomies and reconstruction, no sign of local  recurrence LUNGS:  Clear to auscultation bilaterally.  No wheezes or rhonchi. HEART:  Regular rate and rhythm. No murmur appreciated. ABDOMEN:  Soft, nontender.  Positive, normoactive bowel sounds. No organomegaly palpated. MSK:  No focal spinal tenderness to palpation. Full range of motion bilaterally in the upper extremities. EXTREMITIES:  No peripheral edema.   SKIN:  Clear with no obvious rashes or skin changes. No nail dyscrasia. NEURO:  Nonfocal. Well oriented.  Appropriate affect.   LABORATORY DATA:  None for this visit.  DIAGNOSTIC IMAGING:  None for this visit.      ASSESSMENT AND PLAN:  Colleen Golden is  a pleasant 58 y.o. female with Stage IA right breast invasive lobular carcinoma, ER+/PR+/HER2-, diagnosed in 09/2023, treated with bilateral mastectomies and anti-estrogen therapy with Tamoxifen  beginning in 02/2024.  She presents to the Survivorship Clinic for our initial meeting and routine follow-up post-completion of treatment for breast cancer.    1. Stage IA right breast cancer:  Ms. Pigman is continuing to recover from definitive treatment for breast cancer. She will follow-up with her medical oncologist, Dr.  Loretha in 6 months with history and physical exam per surveillance protocol.  She will continue her anti-estrogen therapy with Tamoxifen .    Today, a comprehensive survivorship care plan and treatment summary was reviewed with the patient today detailing her breast cancer diagnosis, treatment course, potential late/long-term effects of treatment, appropriate follow-up care with recommendations for the future, and patient education resources.  A copy of this summary, along with a letter will be sent to the patient's primary care provider via mail/fax/In Basket message after today's visit.    2. Hot flashes: Reviewed her options including pharmacologic and nonpharmacologic management.  For now she will change the time of day she takes tamoxifen  to see if that helps.  Hopefully  these will improve in a couple of more months.    3. Bone health:  She was given education on specific activities to promote bone health.  4. Cancer screening:  Due to Ms. Jain's history and her age, she should receive screening for skin cancers, colon cancer, and gynecologic cancers.  The information and recommendations are listed on the patient's comprehensive care plan/treatment summary and were reviewed in detail with the patient.    5. Health maintenance and wellness promotion: Ms. Ortman was encouraged to consume 5-7 servings of fruits and vegetables per day. We reviewed the [employer] Rainbow handout.  She was also encouraged to engage in moderate to vigorous exercise for 30 minutes per day most days of the week.  She was instructed to limit her alcohol consumption and continue to abstain from tobacco use.     6. Support services/counseling: It is not uncommon for this period of the patient's cancer care trajectory to be one of many emotions and stressors.   She was given information regarding our available services and encouraged to contact me with any questions or for help enrolling in any of our support group/programs.    Follow up instructions:    -Return to cancer center in 6 months for f/u with Dr. Loretha  -Tiajuana reveal testing ordered -She is welcome to return back to the Survivorship Clinic at any time; no additional follow-up needed at this time.  -Consider referral back to survivorship as a long-term survivor for continued surveillance  The patient was provided an opportunity to ask questions and all were answered. The patient agreed with the plan and demonstrated an understanding of the instructions.   Total encounter time:45 minutes*in face-to-face visit time, chart review, lab review, care coordination, order entry, and documentation of the encounter time.    Morna Kendall, NP 06/02/24 1:39 PM Medical Oncology and Hematology [facility] [address] [place], [place] [address] Tel. [phone]    Fax. [phone]  *Total Encounter Time as defined by the Centers for Medicare and Medicaid Services includes, in addition to the face-to-face time of a patient visit (documented in the note above) non-face-to-face time: obtaining and reviewing outside history, ordering and reviewing medications, tests or procedures, care coordination (communications with other health care professionals or caregivers) and documentation in the  medical record.

## 2024-06-04 ENCOUNTER — Ambulatory Visit: Payer: Self-pay | Admitting: *Deleted

## 2024-06-27 ENCOUNTER — Encounter: Payer: Self-pay | Admitting: Adult Health

## 2024-08-25 ENCOUNTER — Ambulatory Visit

## 2024-12-02 ENCOUNTER — Inpatient Hospital Stay

## 2024-12-02 ENCOUNTER — Inpatient Hospital Stay: Admitting: Hematology and Oncology
